# Patient Record
Sex: Female | Born: 1955
Health system: Southern US, Community
[De-identification: ages and names within clinical notes are randomized; demographics above are authoritative.]

## PROBLEM LIST (undated history)

## (undated) ENCOUNTER — Emergency Department (HOSPITAL_COMMUNITY): Disposition: A | Discharge status: Home / Self Care | Payer: 59

## (undated) DIAGNOSIS — E213 Hyperparathyroidism, unspecified: Secondary | ICD-10-CM

## (undated) DIAGNOSIS — K589 Irritable bowel syndrome without diarrhea: Secondary | ICD-10-CM

## (undated) DIAGNOSIS — F419 Anxiety disorder, unspecified: Secondary | ICD-10-CM

## (undated) DIAGNOSIS — E785 Hyperlipidemia, unspecified: Secondary | ICD-10-CM

## (undated) DIAGNOSIS — Z87442 Personal history of urinary calculi: Secondary | ICD-10-CM

## (undated) DIAGNOSIS — K219 Gastro-esophageal reflux disease without esophagitis: Secondary | ICD-10-CM

## (undated) HISTORY — PX: COLONOSCOPY: SHX174

## (undated) HISTORY — DX: Hyperlipidemia, unspecified: E78.5

## (undated) HISTORY — DX: Anxiety disorder, unspecified: F41.9

## (undated) HISTORY — PX: CERVICAL BIOPSY: SHX590

## (undated) HISTORY — DX: Irritable bowel syndrome, unspecified: K58.9

## (undated) HISTORY — PX: TUMOR REMOVAL: SHX12

## (undated) HISTORY — PX: CHOLECYSTECTOMY: SHX55

---

## 2000-09-24 HISTORY — PX: CHOLECYSTECTOMY, LAPAROSCOPIC: SHX56

## 2001-08-04 ENCOUNTER — Encounter: Payer: Self-pay | Admitting: Internal Medicine

## 2001-08-04 ENCOUNTER — Ambulatory Visit (HOSPITAL_COMMUNITY): Admission: RE | Admit: 2001-08-04 | Discharge: 2001-08-04 | Payer: Self-pay | Admitting: Internal Medicine

## 2001-11-05 ENCOUNTER — Emergency Department (HOSPITAL_COMMUNITY): Admission: EM | Admit: 2001-11-05 | Discharge: 2001-11-05 | Payer: Self-pay

## 2004-11-17 ENCOUNTER — Ambulatory Visit: Payer: Self-pay | Admitting: Internal Medicine

## 2005-06-08 ENCOUNTER — Ambulatory Visit: Payer: Self-pay | Admitting: Internal Medicine

## 2007-06-26 ENCOUNTER — Ambulatory Visit: Payer: Self-pay | Admitting: Internal Medicine

## 2007-08-15 ENCOUNTER — Ambulatory Visit: Payer: Self-pay | Admitting: Internal Medicine

## 2007-08-15 ENCOUNTER — Telehealth: Payer: Self-pay | Admitting: Internal Medicine

## 2007-08-18 ENCOUNTER — Encounter: Payer: Self-pay | Admitting: Internal Medicine

## 2007-10-07 ENCOUNTER — Ambulatory Visit: Payer: Self-pay | Admitting: Gastroenterology

## 2008-03-15 ENCOUNTER — Ambulatory Visit: Payer: Self-pay | Admitting: Internal Medicine

## 2008-03-15 ENCOUNTER — Telehealth (INDEPENDENT_AMBULATORY_CARE_PROVIDER_SITE_OTHER): Payer: Self-pay | Admitting: *Deleted

## 2008-03-17 ENCOUNTER — Encounter (INDEPENDENT_AMBULATORY_CARE_PROVIDER_SITE_OTHER): Payer: Self-pay | Admitting: *Deleted

## 2008-05-14 ENCOUNTER — Encounter: Payer: Self-pay | Admitting: Internal Medicine

## 2008-06-02 ENCOUNTER — Encounter: Payer: Self-pay | Admitting: Internal Medicine

## 2008-07-01 ENCOUNTER — Ambulatory Visit: Payer: Self-pay | Admitting: Internal Medicine

## 2009-01-07 ENCOUNTER — Encounter (INDEPENDENT_AMBULATORY_CARE_PROVIDER_SITE_OTHER): Payer: Self-pay | Admitting: *Deleted

## 2009-08-29 ENCOUNTER — Encounter (INDEPENDENT_AMBULATORY_CARE_PROVIDER_SITE_OTHER): Payer: Self-pay

## 2009-08-30 ENCOUNTER — Ambulatory Visit: Payer: Self-pay | Admitting: Gastroenterology

## 2009-09-13 ENCOUNTER — Ambulatory Visit: Payer: Self-pay | Admitting: Gastroenterology

## 2009-09-13 LAB — HM COLONOSCOPY

## 2009-09-14 ENCOUNTER — Encounter: Payer: Self-pay | Admitting: Gastroenterology

## 2009-10-24 ENCOUNTER — Telehealth (INDEPENDENT_AMBULATORY_CARE_PROVIDER_SITE_OTHER): Payer: Self-pay | Admitting: *Deleted

## 2010-01-24 ENCOUNTER — Ambulatory Visit: Payer: Self-pay | Admitting: Internal Medicine

## 2010-01-24 DIAGNOSIS — R87619 Unspecified abnormal cytological findings in specimens from cervix uteri: Secondary | ICD-10-CM | POA: Insufficient documentation

## 2010-01-24 DIAGNOSIS — Z8719 Personal history of other diseases of the digestive system: Secondary | ICD-10-CM | POA: Insufficient documentation

## 2010-01-24 DIAGNOSIS — N959 Unspecified menopausal and perimenopausal disorder: Secondary | ICD-10-CM | POA: Insufficient documentation

## 2010-01-24 DIAGNOSIS — F411 Generalized anxiety disorder: Secondary | ICD-10-CM | POA: Insufficient documentation

## 2010-07-26 ENCOUNTER — Ambulatory Visit: Payer: Self-pay | Admitting: Internal Medicine

## 2010-08-07 ENCOUNTER — Telehealth (INDEPENDENT_AMBULATORY_CARE_PROVIDER_SITE_OTHER): Payer: Self-pay | Admitting: *Deleted

## 2010-08-08 ENCOUNTER — Telehealth (INDEPENDENT_AMBULATORY_CARE_PROVIDER_SITE_OTHER): Payer: Self-pay | Admitting: *Deleted

## 2010-08-08 ENCOUNTER — Ambulatory Visit: Payer: Self-pay | Admitting: Internal Medicine

## 2010-08-08 DIAGNOSIS — E785 Hyperlipidemia, unspecified: Secondary | ICD-10-CM | POA: Insufficient documentation

## 2010-08-08 LAB — CONVERTED CEMR LAB
Cholesterol, target level: 200 mg/dL
HDL goal, serum: 40 mg/dL
LDL Goal: 110 mg/dL

## 2010-10-22 LAB — CONVERTED CEMR LAB
ALT: 23 units/L (ref 0–35)
AST: 26 units/L (ref 0–37)
Albumin: 4.3 g/dL (ref 3.5–5.2)
Alkaline Phosphatase: 66 units/L (ref 39–117)
BUN: 20 mg/dL (ref 6–23)
Basophils Absolute: 0 10*3/uL (ref 0.0–0.1)
Basophils Relative: 0.3 % (ref 0.0–3.0)
Bilirubin, Direct: 0.1 mg/dL (ref 0.0–0.3)
CO2: 27 meq/L (ref 19–32)
Calcium: 9.9 mg/dL (ref 8.4–10.5)
Chloride: 103 meq/L (ref 96–112)
Cholesterol: 323 mg/dL — ABNORMAL HIGH (ref 0–200)
Creatinine, Ser: 0.7 mg/dL (ref 0.4–1.2)
Direct LDL: 230.8 mg/dL
Eosinophils Absolute: 0.1 10*3/uL (ref 0.0–0.7)
Eosinophils Relative: 2.2 % (ref 0.0–5.0)
GFR calc non Af Amer: 93.1 mL/min (ref >60)
Glucose, Bld: 87 mg/dL (ref 70–99)
HCT: 40.2 % (ref 36.0–46.0)
HDL: 37.2 mg/dL — ABNORMAL LOW (ref >39.00)
Hemoglobin: 13.7 g/dL (ref 12.0–15.0)
Hgb A1c MFr Bld: 5.8 % (ref 4.6–6.5)
Lymphocytes Relative: 56.7 % — ABNORMAL HIGH (ref 12.0–46.0)
Lymphs Abs: 2.7 10*3/uL (ref 0.7–4.0)
MCHC: 34.1 g/dL (ref 30.0–36.0)
MCV: 90.6 fL (ref 78.0–100.0)
Monocytes Absolute: 0.4 10*3/uL (ref 0.1–1.0)
Monocytes Relative: 8.2 % (ref 3.0–12.0)
Neutro Abs: 1.6 10*3/uL (ref 1.4–7.7)
Neutrophils Relative %: 32.6 % — ABNORMAL LOW (ref 43.0–77.0)
Platelets: 199 10*3/uL (ref 150.0–400.0)
Potassium: 3.9 meq/L (ref 3.5–5.1)
RBC: 4.43 M/uL (ref 3.87–5.11)
RDW: 12.8 % (ref 11.5–14.6)
Sodium: 139 meq/L (ref 135–145)
TSH: 0.94 microintl units/mL (ref 0.35–5.50)
Total Bilirubin: 1 mg/dL (ref 0.3–1.2)
Total CHOL/HDL Ratio: 9
Total Protein: 7.1 g/dL (ref 6.0–8.3)
Triglycerides: 211 mg/dL — ABNORMAL HIGH (ref 0.0–149.0)
VLDL: 42.2 mg/dL — ABNORMAL HIGH (ref 0.0–40.0)
Vit D, 25-Hydroxy: 36 ng/mL (ref 30–89)
WBC: 4.8 10*3/uL (ref 4.5–10.5)

## 2010-10-24 NOTE — Miscellaneous (Signed)
Summary: Lec previsit  Clinical Lists Changes  Medications: Added new medication of MOVIPREP 100 GM  SOLR (PEG-KCL-NACL-NASULF-NA ASC-C) As per prep instructions. - Signed Rx of MOVIPREP 100 GM  SOLR (PEG-KCL-NACL-NASULF-NA ASC-C) As per prep instructions.;  #1 x 0;  Signed;  Entered by: Ulis Rias RN;  Authorized by: Meryl Dare MD Clementeen Graham;  Method used: Electronically to [facility] [phone]*, [address] [place], [place]  [address], Ph: [phone], Fax: [phone]    Prescriptions: MOVIPREP 100 GM  SOLR (PEG-KCL-NACL-NASULF-NA ASC-C) As per prep instructions.  #1 x 0   Entered by:   Ulis Rias RN   Authorized by:   Meryl Dare MD [facility]   Signed by:   Ulis Rias RN on 08/30/2009   Method used:   Electronically to        [facility] [phone]* (retail)       [address]       [place], [place]  [address]       Ph: [phone]       Fax: [phone]   RxID:   [id number]

## 2010-10-24 NOTE — Assessment & Plan Note (Signed)
Summary: CPX/NS/KDC   Vital Signs:  Patient profile:   55 year old female Height:      69 inches Weight:      190 pounds Temp:     98.2 degrees F oral Pulse rate:   68 / minute Resp:     14 per minute BP sitting:   118 / 82  (left arm)  Vitals Entered By: Jeremy Johann CMA (Jan 24, 2010 2:31 PM)  CC: CPX, FASTING, General Medical Evaluation Comments REVIEWED MED LIST, PATIENT AGREED DOSE AND INSTRUCTION CORRECT    CC:  CPX, FASTING, and General Medical Evaluation.  History of Present Illness: Melissa Miles is here for a physical; she is asymptomatic. She has lost 8# in 3 months with TLC , exercise & decreased portions & decreased sweets.  Preventive Screening-Counseling & Management  Alcohol-Tobacco     Smoking Status: never  Caffeine-Diet-Exercise     Does Patient Exercise: yes  Allergies (verified): No Known Drug Allergies  Past History:  Past Medical History: Anxiety IBS, Dr Russella Dar  Past Surgical History: Cholecystectomy-2002 Colonoscopy X 3 , all negative; G 2 P 2; S/P  cervical biopsy in12/2010  Family History: 1/2 sister ? bipolar; M ? anxiety, gall bladder disease, S/P cholecystectomy,arthritis; 1/2 bro thyroid disease; F NHL  Social History: Occupation:Pre Engineer, site Married Never Smoked Alcohol use-yes Regular exercise-yes: weights , sit ups  Review of Systems  The patient denies anorexia, fever, vision loss, decreased hearing, hoarseness, chest pain, syncope, dyspnea on exertion, peripheral edema, prolonged cough, headaches, hemoptysis, abdominal pain, melena, hematochezia, severe indigestion/heartburn, hematuria, incontinence, suspicious skin lesions, unusual weight change, abnormal bleeding, enlarged lymph nodes, and angioedema.         PAP negative 01/12/2010 Psych:  Complains of anxiety and irritability; denies depression, easily angered, easily tearful, and panic attacks.  Physical Exam  General:  well-nourished; alert,appropriate and  cooperative throughout examination Head:  Normocephalic and atraumatic without obvious abnormalities. Eyes:  No corneal or conjunctival inflammation noted. Perrla. Funduscopic exam benign, without hemorrhages, exudates or papilledema. Ears:  External ear exam shows no significant lesions or deformities.  Otoscopic examination reveals clear canals, tympanic membranes are intact bilaterally without bulging, retraction, inflammation or discharge. Hearing is grossly normal bilaterally. Nose:  External nasal examination shows no deformity or inflammation. Nasal mucosa are pink and moist without lesions or exudates. Mouth:  Oral mucosa and oropharynx without lesions or exudates.  Teeth in good repair. Neck:  No deformities, masses, or tenderness noted. Breasts:  Mammograms 07/2009 Lungs:  Normal respiratory effort, chest expands symmetrically. Lungs are clear to auscultation, no crackles or wheezes. Heart:  Normal rate and regular rhythm. S1 and S2 normal without gallop, murmur, click, rub.S4 Abdomen:  Bowel sounds positive,abdomen soft and non-tender without masses, organomegaly or hernias noted. Genitalia:  Dr Chevis Pretty seen 01/12/2010 Msk:  No deformity or scoliosis noted of thoracic or lumbar spine.   Pulses:  R and L carotid,radial,dorsalis pedis and posterior tibial pulses are full and equal bilaterally Extremities:  No clubbing, cyanosis, edema, or deformity noted with normal full range of motion of all joints.   minor crepitus of knees Neurologic:  alert & oriented X3 and DTRs symmetrical and normal.   Skin:  Intact without suspicious lesions or rashes Cervical Nodes:  No lymphadenopathy noted Axillary Nodes:  No palpable lymphadenopathy Psych:  memory intact for recent and remote, normally interactive, good eye contact, not anxious appearing, and not depressed appearing.     Impression & Recommendations:  Problem #  1:  ROUTINE GENERAL MEDICAL EXAM@HEALTH  CARE FACL  (ICD-V70.0)  Orders: Venipuncture (24401) TLB-Lipid Panel (80061-LIPID) TLB-BMP (Basic Metabolic Panel-BMET) (80048-METABOL) TLB-CBC Platelet - w/Differential (85025-CBCD) TLB-Hepatic/Liver Function Pnl (80076-HEPATIC) TLB-TSH (Thyroid Stimulating Hormone) (84443-TSH) EKG w/ Interpretation (93000) T-Vitamin D (25-Hydroxy) (02725-36644)  Problem # 2:  ANXIETY DISORDER (ICD-300.00)  weight gain with Paroxetine The following medications were removed from the medication list:    Paroxetine Hcl 20 Mg Tabs (Paroxetine hcl) .Marland Kitchen... 1 by mouth qd Her updated medication list for this problem includes:    Cymbalta 60 Mg Cpep (Duloxetine hcl) .Marland Kitchen... 1 once daily  Orders: Venipuncture (03474)  Problem # 3:  PAP SMEAR, ABNORMAL (ICD-795.00)  as per Dr Chevis Pretty; repeat PAP normal 12/2009  Orders: Venipuncture (25956)  Problem # 4:  IRRITABLE BOWEL SYNDROME, HX OF (ICD-V12.79) Quiescent Orders: Venipuncture (38756)  Problem # 5:  POSTMENOPAUSAL SYNDROME (ICD-627.9)  Orders: T-Vitamin D (25-Hydroxy) (43329-51884)  Complete Medication List: 1)  Multivitamin  2)  Viactiv  3)  Cymbalta 60 Mg Cpep (Duloxetine hcl) .Marland Kitchen.. 1 once daily  Other Orders: Tdap => 60yrs IM (16606) Admin 1st Vaccine (30160) Admin 1st Vaccine [facility]) [phone])  Patient Instructions: 1)  Take Cymbalta 30 mg once daily X 7 days then 60 mg once daily . Prescriptions: CYMBALTA 60 MG CPEP (DULOXETINE HCL) 1 once daily  #30 x 11   Entered and Authorized by:   Marga Melnick MD   Signed by:   Marga Melnick MD on 01/24/2010   Method used:   Print then Give to Patient   RxID:   [id number]    Tetanus/Td Vaccine    Vaccine Type: Tdap    Site: left deltoid    Mfr: GlaxoSmithKline    Dose: 0.5 ml    Route: IM    Given by: Jeremy Johann CMA    Exp. Date: 12/17/2011    Lot #: [id number]    VIS given: 08/12/07 version given Jan 24, 2010.

## 2010-10-24 NOTE — Assessment & Plan Note (Signed)
Summary: tb test//tl  Nurse Visit    Prior Medications: MULTIVITAMIN ()  VIACTIV ()  PAROXETINE HCL 20 MG TABS (PAROXETINE HCL) 1 by mouth qd Current Allergies: No known allergies    PPD Application    Vaccine Type: PPD    Site: left forearm    Mfr: Sanofi Pasteur    Dose: 0.1 ml    Route: ID    Given by: [employer]    Exp. Date: 09/03/2009    Lot #: Merlín.[id number]     ]  Complete Medication List: 1)  Multivitamin  2)  Viactiv  3)  Paroxetine Hcl 20 Mg Tabs (Paroxetine hcl) .Marland Kitchen.. 1 by mouth qd   Appended Document: tb test//tl    Nurse Visit    Prior Medications: MULTIVITAMIN ()  VIACTIV ()  PAROXETINE HCL 20 MG TABS (PAROXETINE HCL) 1 by mouth qd Current Allergies: No known allergies    PPD Results    Date of reading: 08/18/2007    Results: < 5mm    Interpretation: negative     ]

## 2010-10-24 NOTE — Consult Note (Signed)
Summary: [facility]   Imported By: Lanelle Bal 06/10/2008 09:52:25  _____________________________________________________________________  External Attachment:    Type:   Image     Comment:   External Document  Appended Document: [facility] Dr Gerrit Friends: benign cystic mass

## 2010-10-24 NOTE — Letter (Signed)
Summary: Primary Care Consult Scheduled Letter  [facility]  [address] [place], [place] [address]   Phone: [phone]  Fax: [phone]      03/17/2008 MRN: [id number]  [facility] [address] [place], [place]  [address]    Dear Melissa Miles,      We have scheduled an appointment for you.  At the recommendation of Dr.Hopper, we have scheduled you a consult with DR. Gerkin on August 3rd at 4:00 check in at 3:30pm.  Their address is [facility] [address] [phone]. The office phone number is [phone].  If this appointment day and time is not convenient for you, please feel free to call the office of the doctor you are being referred to at the number listed above and reschedule the appointment.     It is important for you to keep your scheduled appointments. We are here to make sure you are given good patient care. If you have questions or you have made changes to your appointment, please notify us at  [phone], ask for Tiffany.    Thank you,  Patient Care Coordinator [employer] [facility]

## 2010-10-24 NOTE — Progress Notes (Signed)
Summary: Appointment Due  Phone Note Outgoing Call Call back at [facility] Phone [phone]   Call placed by: Shonna Chock,  October 24, 2009 1:34 PM Call placed to: Patient Summary of Call: Melissa Miles PLEASE contact patient to schedule a CPX, patient's last appointment with Dr.Hopper was 06/2008. An appointment is necessary to continue refilling medications.  Thanks./Chrae Malloy  October 24, 2009 1:35 PM     Additional Follow-up for Phone Call Additional follow up Details #2::    PATIENT HAS A APPT ON MAY 3,2011...Marland KitchenMarland KitchenBarb Merino  October 24, 2009 3:37 PM  Follow-up by: Barb Merino,  October 24, 2009 3:37 PM

## 2010-10-24 NOTE — Procedures (Signed)
Summary: Colonoscopy  Patient: Melissa Miles Note: All result statuses are Final unless otherwise noted.  Tests: (1) Colonoscopy (COL)   COL Colonoscopy           DONE     [facility]     [address]     [place], [place]  [address]           COLONOSCOPY PROCEDURE REPORT           PATIENT:  Melissa Miles, Melissa Miles  MR#:  [id number]     BIRTHDATE:  1955-09-27, 53 yrs. old  GENDER:  female           ENDOSCOPIST:  Judie Petit T. Russella Dar, MD, [facility]           PROCEDURE DATE:  09/13/2009     PROCEDURE:  Colonoscopy with biopsy     ASA CLASS:  Class II     INDICATIONS:  1) Routine Risk Screening           MEDICATIONS:   Fentanyl 100 mcg IV, Versed 9 mg IV           DESCRIPTION OF PROCEDURE:   After the risks benefits and     alternatives of the procedure were thoroughly explained, informed     consent was obtained.  Digital rectal exam was performed and     revealed no abnormalities.   The LB [id number] endoscope     was introduced through the anus and advanced to the cecum, which     was identified by both the appendix and ileocecal valve, without     limitations.  The quality of the prep was excellent, using     MoviPrep.  The instrument was then slowly withdrawn as the colon     was fully examined.     <<PROCEDUREIMAGES>>           FINDINGS:  A normal appearing cecum, ileocecal valve, and     appendiceal orifice were identified. The ascending, hepatic     flexure, transverse, splenic flexure, descending, sigmoid colon,     and rectum appeared unremarkable.  Retroflexed views in the rectum     revealed a hypertrophied anal papillae. Biopsies obtained and sent     to pathology.   The time to cecum =  2  minutes. The scope was     then withdrawn (time =  9  min) from the patient and the procedure     completed.           COMPLICATIONS:  None           ENDOSCOPIC IMPRESSION:     1) Normal colon     2) Hypertrophied anal papillae           RECOMMENDATIONS:     1) Await  pathology results     2) Continue current colorectal screening recommendations for     "routine risk" patients with a repeat colonoscopy in 10 years.           Venita Lick. Russella Dar, MD, Clementeen Graham           CC: Teodora Medici, MD           n.     Rosalie DoctorVenita Lick. Reyhan Moronta at 09/13/2009 10:02 AM           Edyth Gunnels, [id number]  Note: An exclamation mark (!) indicates a result that was not dispersed into the flowsheet. Document Creation Date: 09/13/2009 10:03 AM _______________________________________________________________________  (  1) Order result status: Final Collection or observation date-time: 09/13/2009 09:56 Requested date-time:  Receipt date-time:  Reported date-time:  Referring Physician:   Ordering Physician: Claudette Head [phone]) Specimen Source:  Source: Launa Grill Order Number: [id number] Lab site:   Appended Document: Colonoscopy recall     Procedures Next Due Date:    Colonoscopy: 08/2019

## 2010-10-24 NOTE — Letter (Signed)
Summary: [facility] Instructions  [facility]  [address] [place], [place] [address]   Phone: [phone]  Fax: [phone]       Melissa Miles    17-Jun-1956    MRN: [id number]        Procedure Day /Date: Tuesday 12/21/2010_     Arrival Time: 8:30 am      Procedure Time: 9:30 am     Location of Procedure:                    _x _  [facility] (4th Floor)   PREPARATION FOR COLONOSCOPY WITH MOVIPREP   Starting 5 days prior to your procedure Thursday 12/16 do not eat nuts, seeds, popcorn, corn, beans, peas,  salads, or any raw vegetables.  Do not take any fiber supplements (e.g. Metamucil, Citrucel, and Benefiber).  THE DAY BEFORE YOUR PROCEDURE         DATE: Monday 12/20  1.  Drink clear liquids the entire day-NO SOLID FOOD  2.  Do not drink anything colored red or purple.  Avoid juices with pulp.  No orange juice.  3.  Drink at least 64 oz. (8 glasses) of fluid/clear liquids during the day to prevent dehydration and help the prep work efficiently.  CLEAR LIQUIDS INCLUDE: Water Jello Ice Popsicles Tea (sugar ok, no milk/cream) Powdered fruit flavored drinks Coffee (sugar ok, no milk/cream) Gatorade Juice: apple, white grape, white cranberry  Lemonade Clear bullion, consomm, broth Carbonated beverages (any kind) Strained chicken noodle soup Hard Candy                             4.  In the morning, mix first dose of MoviPrep solution:    Empty 1 Pouch A and 1 Pouch B into the disposable container    Add lukewarm drinking water to the top line of the container. Mix to dissolve    Refrigerate (mixed solution should be used within 24 hrs)  5.  Begin drinking the prep at 5:00 p.m. The MoviPrep container is divided by 4 marks.   Every 15 minutes drink the solution down to the next mark (approximately 8 oz) until the full liter is complete.   6.  Follow completed prep with 16 oz of clear liquid of your choice (Nothing red or purple).   Continue to drink clear liquids until bedtime.  7.  Before going to bed, mix second dose of MoviPrep solution:    Empty 1 Pouch A and 1 Pouch B into the disposable container    Add lukewarm drinking water to the top line of the container. Mix to dissolve    Refrigerate  THE DAY OF YOUR PROCEDURE      DATE: Tuesday 12/21 Beginning at 4:30 a.m. (5 hours before procedure):         1. Every 15 minutes, drink the solution down to the next mark (approx 8 oz) until the full liter is complete.  2. Follow completed prep with 16 oz. of clear liquid of your choice.    3. You may drink clear liquids until 7:30 am  (2 HOURS BEFORE PROCEDURE).   MEDICATION INSTRUCTIONS  Unless otherwise instructed, you should take regular prescription medications with a small sip of water   as early as possible the morning of your procedure.          OTHER INSTRUCTIONS  You will need a responsible adult at least  54 years of age to accompany you and drive you home.   This person must remain in the waiting room during your procedure.  Wear loose fitting clothing that is easily removed.  Leave jewelry and other valuables at home.  However, you may wish to bring a book to read or  an iPod/MP3 player to listen to music as you wait for your procedure to start.  Remove all body piercing jewelry and leave at home.  Total time from sign-in until discharge is approximately 2-3 hours.  You should go home directly after your procedure and rest.  You can resume normal activities the  day after your procedure.  The day of your procedure you should not:   Drive   Make legal decisions   Operate machinery   Drink alcohol   Return to work  You will receive specific instructions about eating, activities and medications before you leave.    The above instructions have been reviewed and explained to me by   Ulis Rias RN  August 30, 2009 1:15 PM    I fully understand and can verbalize these  instructions _____________________________ Date _________

## 2010-10-24 NOTE — Assessment & Plan Note (Signed)
Summary: knot on bottom right cheek--acute only--tl   Vital Signs:  Patient Profile:   55 Years Old Female Weight:      184.6 pounds Temp:     98.6 degrees F oral Pulse rate:   76 / minute BP sitting:   120 / 78  (left arm) Cuff size:   large  Pt. in pain?   no  Vitals Entered By: Shonna Chock (March 15, 2008 1:31 PM)                  Chief Complaint:  KNOT ON BOTTOM (RIGHT SIDE) X COUPLE MONTHS.  History of Present Illness: Weight loss is purposeful with CVE & decreased portions. No constitutional symptoms; lesion noted incidentally post shower.Lesion essentially stable over 8 weeks or more. Father had lymphoma.    Current Allergies (reviewed today): No known allergies   Past Surgical History:    Cholecystectomy-2001?    Risk Factors:  Tobacco use:  never Alcohol use:  no Exercise:  yes    Times per week:  5-7    Type:  WALKING,CARDIO,WEIGHTS   Review of Systems  General      Denies chills, fatigue, fever, and sweats.   Physical Exam  General:     Well-developed,well-nourished,in no acute distress; alert,appropriate and cooperative throughout examination Abdomen:     Bowel sounds positive,abdomen soft and non-tender without masses, organomegaly or hernias noted.Dullness RUQ w/o nodule Skin:     26 X 24 mm firm Subcutaneous  structure which is not movable ; it appears to transilluminate  Cervical Nodes:     No lymphadenopathy noted Axillary Nodes:     No palpable lymphadenopathy Inguinal Nodes:     No significant adenopathy    Impression & Recommendations:  Problem # 1:  MASS, SUPERFICIAL (ICD-782.2) Assessment: Unchanged R/O lipoma vs granuloma post trauma Orders: Surgical Referral (Surgery)   Problem # 2:  LYMPHOMA, FAMILY HX (ICD-V16.7)  Orders: Surgical Referral (Surgery)   Complete Medication List: 1)  Multivitamin  2)  Viactiv  3)  Paroxetine Hcl 20 Mg Tabs (Paroxetine hcl) .Marland Kitchen.. 1 by mouth qd   Patient Instructions: 1)   Monitor size of mass  weekly until seen By Dr Gerrit Friends   ]

## 2010-10-24 NOTE — Assessment & Plan Note (Signed)
Summary: roa discuss med. refill,cbs  Medications Added * MULTIVITAMIN  * VIACTIV  PAROXETINE HCL 20 MG TABS (PAROXETINE HCL) 1 by mouth qd      Allergies Added: NKDA  Vital Signs:  Patient Profile:   55 Years Old Female Weight:      200.25 pounds Pulse rate:   64 / minute Pulse rhythm:   regular BP sitting:   138 / 80  (left arm) Cuff size:   large  Pt. in pain?   no  Vitals Entered By: Wendall Stade (June 26, 2007 3:30 PM)                  Chief Complaint:  med refill.  History of Present Illness: Paroxetine is a valuable tool to prevent anxiety. Neurotransmitter sheet reviewed; serotonin deficiency suggested.  Current Allergies (reviewed today): No known allergies  Updated/Current Medications (including changes made in today's visit):  * MULTIVITAMIN  * VIACTIV  PAROXETINE HCL 20 MG TABS (PAROXETINE HCL) 1 by mouth qd   Past Medical History:    Anxiety   Family History:    Sister ? bipolar; M ? anxiety    Review of Systems  CV      Denies palpitations.  GI      Denies diarrhea.  Derm      Denies flushing.  Neuro      Denies headaches.  Psych      Complains of anxiety, depression, easily tearful, and irritability.      Denies panic attacks and sense of great danger.   Physical Exam  General:     Well-developed,well-nourished,in no acute distress; alert,appropriate and cooperative throughout examination Neck:     No deformities, masses, or tenderness noted. Heart:     Normal rate and regular rhythm. S1 and S2 normal without gallop, murmur, click, rub or other extra sounds. Psych:     Oriented X3, memory intact for recent and remote, normally interactive, good eye contact, not anxious appearing, and not depressed appearing.      Impression & Recommendations:  Problem # 1:  ANXIETY (ICD-300.00)  Her updated medication list for this problem includes:    Paroxetine Hcl 20 Mg Tabs (Paroxetine hcl) .Marland Kitchen... 1 by mouth  qd   Complete Medication List: 1)  Multivitamin  2)  Viactiv  3)  Paroxetine Hcl 20 Mg Tabs (Paroxetine hcl) .Marland Kitchen.. 1 by mouth qd     Prescriptions: PAROXETINE HCL 20 MG TABS (PAROXETINE HCL) 1 by mouth qd  #90 x 3   Entered and Authorized by:   Marga Melnick MD   Signed by:   Marga Melnick MD on 06/26/2007   Method used:   Print then Give to Patient   RxID:   [id number]  ]

## 2010-10-24 NOTE — Assessment & Plan Note (Signed)
Summary: flu shot/cbs   Nurse Visit    Prior Medications: MULTIVITAMIN ()  VIACTIV ()  PAROXETINE HCL 20 MG TABS (PAROXETINE HCL) 1 by mouth qd Current Allergies: No known allergies   Flu Vaccine Consent Questions     Do you have a history of severe allergic reactions to this vaccine? no    Any prior history of allergic reactions to egg and/or gelatin? no    Do you have a sensitivity to the preservative Thimersol? no    Do you have a past history of Guillan-Barre Syndrome? no    Do you currently have an acute febrile illness? no    Have you ever had a severe reaction to latex? no    Vaccine information given and explained to patient? yes    Are you currently pregnant? no    Lot Number:[id number]   Site Given Right Deltoid IM  Chrae Malloy  July 02, 2008 8:44 AM   Orders Added: 1)  Admin 1st Vaccine [90471] 2)  Flu Vaccine 2yrs + Baden.Dew    ]

## 2010-10-24 NOTE — Op Note (Signed)
Summary: Excision Soft Tissue Mass/[facility]  [facility]cision Soft Tissue [facility]   Imported By: Lanelle Bal 05/19/2008 10:11:42  _____________________________________________________________________  External Attachment:    Type:   Image     Comment:   External Document

## 2010-10-24 NOTE — Letter (Signed)
Summary: Recall Colonoscopy Letter  [facility]  [address] [place], [place] [address]   Phone: [phone]  Fax: [phone]      January 07, 2009 MRN: [id number]   [facility] [address] [place], [place]  [address]   Dear Ms. Melissa Miles,   According to your medical record, it is time for you to schedule a Colonoscopy. The American Cancer Society recommends this procedure as a method to detect early colon cancer. Patients with a family history of colon cancer, or a personal history of colon polyps or inflammatory bowel disease are at increased risk.  This letter has beeen generated based on the recommendations made at the time of your procedure. If you feel that in your particular situation this may no longer apply, please contact our office.  Please call our office at [phone] to schedule this appointment or to update your records at your earliest convenience.  Thank you for cooperating with [place] to provide you with the very best care possible.   Sincerely,  Judie Petit T. Russella Dar, M.D.  [facility] [phone]

## 2010-10-24 NOTE — Progress Notes (Signed)
Summary: EMPLOYMENT MED REPORT--NEED ASAP  Phone Note Call from Patient Call back at Work Phone [phone]   Caller: Patient Summary of Call: DR Rosabell Geyer PATIENT  PATIENT DROPPED OFF EMPLOYMENT MED REPORT (HAD LAB APPT TODAY TO GET TB TEST--WILL RETURN MONDAY FOR READING)  NEEDS FORM ASAP PLEASE -- WILL ATTACH FORM TO CHART AND TAKE BACK TO [facility]  PLEASE CALL [phone] WHEN READY Initial call taken by: Jerolyn Shin,  August 15, 2007 3:59 PM  Follow-up for Phone Call        hopp she needs monday Follow-up by: Wendall Stade,  August 15, 2007 4:07 PM  Additional Follow-up for Phone Call Additional follow up Details #1::        form ready for PPD reading 11/24 Additional Follow-up by: Marga Melnick MD,  August 15, 2007 5:14 PM         Appended Document: EMPLOYMENT MED REPORT--NEED ASAP patient picked up form on monday and copy scanned into chart

## 2010-10-24 NOTE — Letter (Signed)
Summary: Patient Notice- Colon Biospy Results  [facility]  [address] [place], [place] [address]   Phone: [phone]  Fax: [phone]        September 14, 2009 MRN: [id number]    [facility] [address] [place], [place]  [address]    Dear Melissa Miles,  I am pleased to inform you that the biopsies taken during your recent colonoscopy did not show any evidence of cancer upon pathologic examination. The biopsies showed findings consistent with a hypertrophied anal papillae as I expected.   No further action is needed at this time.  Please follow-up with      your primary care physician for your other healthcare needs.  Continue with the treatment plan as outlined on the day of your      exam.  Please call us if you are having persistent problems or have questions about your condition that have not been fully answered at this time.  Sincerely,  Meryl Dare MD [facility]  This letter has been electronically signed by your physician.    Appended Document: Patient Notice- Colon Biospy Results Letter mailed 12.27.10.

## 2010-10-24 NOTE — Assessment & Plan Note (Signed)
Summary: F/U on Labs/scm   Vital Signs:  Patient profile:   55 year old female Weight:      171.4 pounds BMI:     25.40 Pulse rate:   76 / minute Resp:     14 per minute BP sitting:   110 / 62  (left arm) Cuff size:   large  Vitals Entered By: Shonna Chock CMA (August 08, 2010 3:52 PM) CC: Follow-up on labs (copy of NMR given) , Lipid Management   CC:  Follow-up on labs (copy of NMR given)  and Lipid Management.  History of Present Illness: Hyperlipidemia Follow-Up      This is a 55 year old woman who presents for Hyperlipidemia follow-up.  The patient denies the following symptoms: chest pain/pressure, exercise intolerance, dypsnea, palpitations, syncope, and pedal edema.  Dietary compliance has been  very good; she has lost 29#.  The patient reports exercising 5-6X/week on treadmill & walking .  Adjunctive measures currently used by the patient include ASA.  NMR reviewed & risks discussed.  Lipid Management History:      Positive NCEP/ATP III risk factors include HDL cholesterol less than 40 and family history for ischemic heart disease (males less than 2 years old).  Negative NCEP/ATP III risk factors include female age less than 36 years old, non-diabetic, non-tobacco-user status, non-hypertensive, no ASHD (atherosclerotic heart disease), no prior stroke/TIA, no peripheral vascular disease, and no history of aortic aneurysm.     Current Medications (verified): 1)  Multivitamin 2)  Viactiv 3)  Cymbalta 60 Mg Cpep (Duloxetine Hcl) .Marland Kitchen.. 1 Once Daily 4)  Vitamin D3 1000 Unit Tabs (Cholecalciferol) .Marland Kitchen.. 1 By Mouth Once Daily  Allergies (verified): No Known Drug Allergies  Past History:  Past Medical History: Anxiety IBS, Dr Russella Dar Hyperlipidemia: NMR Lipoprofile 07/2010: LDL 221(2819/1785), HDL 43, TG 159. LD goal = < 110.[place] Study LDL goal = < 130.(1/2  bro : MI @ 79)  Physical Exam  General:  well-nourished;alert,appropriate and cooperative throughout  examination Heart:  Normal rate and regular rhythm. S1 and S2 normal without gallop, murmur, click, rub or other extra sounds. Pulses:  R and L carotid,radial,dorsalis pedis and posterior tibial pulses are full and equal bilaterally Psych:  Oriented X3.  Focused & intelligent   Impression & Recommendations:  Problem # 1:  HYPERLIPIDEMIA (ICD-272.4)  Her updated medication list for this problem includes:    Pravastatin Sodium 20 Mg Tabs (Pravastatin sodium) .Marland Kitchen... 1 at bedtime  Problem # 2:  ISCHEMIC HEART DISEASE, FAMILY HX (ICD-V17.3)  Complete Medication List: 1)  Multivitamin  2)  Viactiv  3)  Cymbalta 60 Mg Cpep (Duloxetine hcl) .Marland Kitchen.. 1 once daily 4)  Vitamin D3 1000 Unit Tabs (Cholecalciferol) .Marland Kitchen.. 1 by mouth once daily 5)  Pravastatin Sodium 20 Mg Tabs (Pravastatin sodium) .Marland Kitchen.. 1 at bedtime  Lipid Assessment/Plan:      Based on NCEP/ATP III, the patient's risk factor category is "2 or more risk factors and a calculated 10 year CAD risk of > 20%".  The patient's lipid goals are as follows: Total cholesterol goal is 200; LDL cholesterol goal is 110; HDL cholesterol goal is 40; Triglyceride goal is 150.    Patient Instructions: 1)  Please schedule a follow-up appointment in 3 months. 2)  Hepatic Panel prior to visit, ICD-9:995.20 3)  Lipid Panel prior to visit, ICD-9:272.4 Prescriptions: PRAVASTATIN SODIUM 20 MG TABS (PRAVASTATIN SODIUM) 1 at bedtime  #90 x 0   Entered and Authorized by:   Chrissie Noa  Calbert Hulsebus MD   Signed by:   Marga Melnick MD on 08/08/2010   Method used:   Print then Give to Patient   RxID:   [id number]    Orders Added: 1)  Est. Patient Level III [95621]

## 2010-10-24 NOTE — Progress Notes (Signed)
Summary: knot on bottom  Phone Note Call from Patient   Caller: Patient Summary of Call: dr. hopper Patient has a big knot under her skin on her right cheek on her bottom and would like to be seen this morning she has the bring her mother in at 10:30. offered an appt for tomorrow at 9:30 pt refused  Initial call taken by: Charolette Child,  March 15, 2008 9:56 AM  Follow-up for Phone Call        spoke with chrae recommend that the patient be worked in this afternoon. I informed patient and she is coming into the office at 12:45 Follow-up by: Charolette Child,  March 15, 2008 10:01 AM

## 2010-10-24 NOTE — Progress Notes (Signed)
Summary: LAB RESULTS  Phone Note Call from Patient Call back at Work Phone [phone]   Caller: Patient Reason for Call: Talk to Nurse Summary of Call: PATIENT CALLED MADE APPT TO DISCUSS NMR, BUT IS REQUESTING TO SPEAK W/CHRAE PRIOR, WORRIED SOMETHING IS WRONG. Initial call taken by: Magdalen Spatz [facility],  August 08, 2010 8:27 AM  Follow-up for Phone Call        I spoke with patient and briefly went over her NMR and changed appointment time to today to ease patient instead of waiting until Friday (Friday appointment cancelled) Follow-up by: Shonna Chock CMA,  August 08, 2010 8:44 AM

## 2010-10-24 NOTE — Progress Notes (Signed)
Summary: Appointment Due  Phone Note Outgoing Call Call back at Home Phone [phone]   Call placed by: Shonna Chock CMA,  August 07, 2010 4:00 PM Summary of Call: Left message on voicemail for patient to call and schedule appointment to discuss NMR (special chlosterol test), copy of report held at my desk  and copy sent to be scanned./Chrae [facility] CMA  August 07, 2010 4:02 PM

## 2010-10-24 NOTE — Miscellaneous (Signed)
Summary: Immunizations  Immunizations   Imported By: Freddy Jaksch 08/18/2007 16:21:43  _____________________________________________________________________  External Attachment:    Type:   Image     Comment:   External Document

## 2010-10-27 NOTE — Letter (Signed)
Summary: STAFF MEDICAL REPORT  STAFF MEDICAL REPORT   Imported By: Freddy Jaksch 08/28/2007 16:51:09  _____________________________________________________________________  External Attachment:    Type:   Image     Comment:   External Document

## 2011-02-06 NOTE — Assessment & Plan Note (Signed)
[facility]                         GASTROENTEROLOGY OFFICE NOTE   NAME:Kratz, CHELSAE ZANELLA                    MRN:          [id number]  DATE:10/07/2007                            DOB:          30-Dec-1955    This is a 55 year old white female whom I have followed for diarrhea  predominant irritable bowel syndrome. She underwent colonoscopy in June  of 2003, that was entirely normal to the terminal ileum with random  biopsies of the colon showing no pathology. She has had intermittent  troubles with her irritable bowel syndrome and it initially responded  well to the p.r.n. use of dicyclomine. 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 She states that she  had worsening problems with urgent watery non-bloody diarrhea and mucous  per rectum. After the holidays her bowel habits returned to normal and  she has no gastrointestinal complaints today. She notes no weight loss,  fevers or chills. She denies any antibiotic usage over the past several  months and notes no medication changes. There is no family history of  colon cancer, colon polyps or inflammatory bowel disease.   PAST MEDICAL HISTORY:  1. Diarrhea predominant irritable bowel syndrome.  2. Status post cholecystectomy.   CURRENT MEDICATIONS:  1. Paroxetine 15 mg daily.  2. Imodium AD daily p.r.n.   MEDICATION ALLERGIES:  None known.   SOCIAL HISTORY:  Per the handwritten form.   REVIEW OF SYSTEMS:  Per the handwritten form.   PHYSICAL EXAMINATION:  In no acute distress. Height 6 feet, weight 196  pounds. Blood pressure 130/82, pulse 80 and regular.  HEENT: Anicteric sclerae. Oropharynx clear.  CHEST: Clear to auscultation bilaterally.  CARDIAC: Regular rate and rhythm without murmurs appreciated.  ABDOMEN: Soft and nontender. Nondistended. Normoactive bowel sounds. No  palpable organomegaly, masses or hernias.  EXTREMITIES: Without clubbing, cyanosis or edema.  NEUROLOGIC: Alert and oriented x3. Grossly nonfocal.   ASSESSMENT/PLAN:  Flare of diarrhea predominant irritable bowel syndrome  and her bowel habits have returned to normal. Discontinue dicyclomine.  May use glycopyrrolate 2 mg one-half  to one whole tablet b.i.d. as needed along with Imodium AD b.i.d. as  needed. If these measures  are not effective in controlling her symptoms she is to return for  followup, otherwise, I will see her in one year.     Venita Lick. Russella Dar, MD, [facility]  Electronically Signed    MTS/MedQ  DD: 10/07/2007  DT: 10/07/2007  Job #: [id number]

## 2011-02-26 ENCOUNTER — Telehealth: Payer: Self-pay | Admitting: Internal Medicine

## 2011-02-26 MED ORDER — DULOXETINE HCL 60 MG PO CPEP: 60.0000 mg | ORAL_CAPSULE | Freq: Every day | ORAL | Status: DC

## 2011-03-31 ENCOUNTER — Other Ambulatory Visit: Payer: Self-pay | Admitting: Internal Medicine

## 2011-09-09 ENCOUNTER — Other Ambulatory Visit: Payer: Self-pay | Admitting: Internal Medicine

## 2011-09-10 NOTE — Telephone Encounter (Signed)
Patient needs to schedule a CPX  

## 2011-10-12 ENCOUNTER — Other Ambulatory Visit: Payer: Self-pay | Admitting: Internal Medicine

## 2011-10-12 MED ORDER — PRAVASTATIN SODIUM 20 MG PO TABS: 20.0000 mg | ORAL_TABLET | Freq: Every day | ORAL | Status: DC

## 2011-10-12 NOTE — Telephone Encounter (Signed)
Rx sent 

## 2011-10-16 ENCOUNTER — Other Ambulatory Visit: Payer: Self-pay | Admitting: Internal Medicine

## 2011-11-07 ENCOUNTER — Other Ambulatory Visit: Payer: Self-pay | Admitting: Gynecology

## 2011-11-15 ENCOUNTER — Other Ambulatory Visit: Payer: Self-pay | Admitting: Internal Medicine

## 2011-11-29 ENCOUNTER — Telehealth: Payer: Self-pay | Admitting: *Deleted

## 2011-11-29 NOTE — Telephone Encounter (Signed)
[facility] Triage Call Report Triage Record Num: [id number] Operator: Aundra Millet Patient Name: Melissa Miles Call Date & Time: 11/29/2011 9:20:40AM Patient Phone: [phone] PCP: Marga Melnick Patient Gender: Female PCP Fax : [phone] Patient DOB: 1955-10-01 Practice Name: [facility] Reason for Call: Caller: Luvenia/Mother; PCP: Marga Melnick; CB#: [phone]; ; ; Call regarding Sore Throat; Onset of bouts of deep sore throat since 11/22/2011. Has tried gargling, warm fluids and Advil but still bothersome. Has had some slight cough and have taken Mucinex. RN reached See in 24 hrs DISP for swollen glands on sides of neck per Sore throat protocol . No EPIC appts seen - RN called office and transffered to [facility] -- appt available for Friday AM 11/30/2011 or can be seen today at [facility] Protocol(s) Used: Sore Throat or Hoarseness Recommended Outcome per Protocol: See Provider within 24 hours Reason for Outcome: New onset of painful, swollen glands on sides of neck or under jaw Care Advice: Call provider if any of the following signs and symptoms develop: severe sore throat, enlarged tonsils with white or yellow patches, tender swollen glands, fever, generally feel ill, persistent low grade headache, or abdominal pain. ~ Apply warm, moist soaks or compresses to the affected area for 20-30 minutes 3 to 4 times per day. Avoid burning skin by using water no hotter than bath water and by not lying on the compresses. ~ ~ SYMPTOM / CONDITION MANAGEMENT Most adults need to drink 6-10 eight-ounce glasses (1.2-2.0 liters) of fluids per day unless previously told to limit fluid intake for other medical reasons. Limit fluids that contain caffeine, sugar or alcohol. Urine will be a very light yellow color when you drink enough fluids. ~ Analgesic/Antipyretic Advice - Acetaminophen: Consider acetaminophen as directed on label or by pharmacist/provider for pain or  fever PRECAUTIONS: - Use if there is no history of liver disease, alcoholism, or intake of three or more alcohol drinks per day - Only if approved by provider during pregnancy or when breastfeeding - During pregnancy, acetaminophen should not be taken more than 3 consecutive days without telling provider - Do not exceed recommended dose or frequency ~ Sore Throat Relief: - Use warm salt water gargles 3 to 4 times/day, as needed (1/2 tsp. salt in 8 oz. [.2 liters] water). - Suck on hard candy, nonprescription or herbal throat lozenges (sugar-free if diabetic) - Eat soothing, soft food/fluids (broths, soups, or honey and lemon juice in hot tea, Popsicles, frozen yogurt or sherbet, scrambled eggs, cooked cereals, Jell-O or puddings) whichever is most comforting. - Avoid eating salty, spicy or acidic foods. ~ Analgesic/Antipyretic Advice - NSAIDs: Consider aspirin, ibuprofen, naproxen or ketoprofen for pain or fever as directed on label or by pharmacist/provider. PRECAUTIONS: - If over 80 years of age, should not take longer than 1 week without consulting provider. EXCEPTIONS: - Should not be used if taking blood thinners or have bleeding problems. - Do not use if have history of sensitivity/allergy to any of these medications; or history of cardiovascular, ulcer, kidney, liver disease or diabetes unless approved by provider.

## 2011-11-29 NOTE — Telephone Encounter (Signed)
Noted, patient with pending appointment tomorrow

## 2011-11-30 ENCOUNTER — Encounter: Payer: Self-pay | Admitting: Family Medicine

## 2011-11-30 ENCOUNTER — Ambulatory Visit (INDEPENDENT_AMBULATORY_CARE_PROVIDER_SITE_OTHER): Payer: BC Managed Care – PPO | Admitting: Family Medicine

## 2011-11-30 VITALS — BP 132/80 | HR 100 | Temp 98.8°F | Wt 195.6 lb

## 2011-11-30 DIAGNOSIS — J029 Acute pharyngitis, unspecified: Secondary | ICD-10-CM

## 2011-11-30 LAB — POCT RAPID STREP A (OFFICE): Rapid Strep A Screen: NEGATIVE

## 2011-11-30 NOTE — Assessment & Plan Note (Signed)
New.  No evidence of bacterial infxn on PE.  Most likely viral w/ PND contribution.  Start OTC antihistamine.  Reviewed supportive care and red flags that should prompt return.  Pt expressed understanding and is in agreement w/ plan.

## 2011-11-30 NOTE — Patient Instructions (Signed)
This is a viral illness and should improve w/ time Drink plenty of fluids Alternate Tylenol and Ibuprofen every 4 hrs for pain Add Claritin or Zyrtec daily to decrease post nasal drip REST! Call with any questions or concerns Hang in there!

## 2011-11-30 NOTE — Progress Notes (Signed)
  Subjective:    Patient ID: Melissa Miles, female    DOB: 24-May-1956, 56 y.o.   MRN: [id number]  HPI Sore throat- reports sxs are worse at night, 'it's like someone has put a blow torch to it'.  sxs started 3 weeks ago w/ cough, hoarseness.  This improved and then 1 week ago throat started hurting.  Taking ibuprofen and gargling.  + sick contacts.  Denies PND.  + dry cough.  No ear pain, sinus pressure.  No fevers.   Review of Systems For ROS see HPI     Objective:   Physical Exam  Vitals reviewed. Constitutional: She appears well-developed and well-nourished. No distress.  HENT:  Head: Normocephalic and atraumatic.  Right Ear: Tympanic membrane normal.  Left Ear: Tympanic membrane normal.  Nose: Mucosal edema and rhinorrhea present. Right sinus exhibits no maxillary sinus tenderness and no frontal sinus tenderness. Left sinus exhibits no maxillary sinus tenderness and no frontal sinus tenderness.  Mouth/Throat: Mucous membranes are normal. Posterior oropharyngeal erythema (w/ PND) present.  Eyes: Conjunctivae and EOM are normal. Pupils are equal, round, and reactive to light.  Neck: Normal range of motion. Neck supple.  Cardiovascular: Normal rate, regular rhythm and normal heart sounds.   Pulmonary/Chest: Effort normal and breath sounds normal. No respiratory distress. She has no wheezes. She has no rales.  Lymphadenopathy:    She has no cervical adenopathy.          Assessment & Plan:

## 2011-12-06 ENCOUNTER — Encounter: Payer: Self-pay | Admitting: Internal Medicine

## 2011-12-06 ENCOUNTER — Ambulatory Visit (INDEPENDENT_AMBULATORY_CARE_PROVIDER_SITE_OTHER): Payer: BC Managed Care – PPO | Admitting: Internal Medicine

## 2011-12-06 VITALS — BP 126/78 | HR 99 | Temp 98.2°F | Resp 14 | Ht 68.75 in | Wt 191.4 lb

## 2011-12-06 DIAGNOSIS — E785 Hyperlipidemia, unspecified: Secondary | ICD-10-CM

## 2011-12-06 DIAGNOSIS — Z0000 Encounter for general adult medical examination without abnormal findings: Secondary | ICD-10-CM

## 2011-12-06 MED ORDER — DULOXETINE HCL 60 MG PO CPEP: 60.0000 mg | ORAL_CAPSULE | Freq: Every day | ORAL | Status: DC

## 2011-12-06 NOTE — Patient Instructions (Signed)
Preventive Health Care: Exercise  30-45  minutes a day, 3-4 days a week. Walking is especially valuable in preventing Osteoporosis. Eat a low-fat diet with lots of fruits and vegetables, up to 7-9 servings per day. Avoid obesity; your goal = waist less than 35 inches.Consume less than 30 grams of sugar per day from foods & drinks with High Fructose Corn Syrup as # 1,2,3 or #4 on label. Health Care Power of Attorney & Living Will place you in charge of your health care  decisions. Verify these are  in place. Depression is common in our stressful world. If fit progresses despite interventions discussed, please call.

## 2011-12-06 NOTE — Progress Notes (Signed)
Subjective:    Patient ID: Melissa Miles, female    DOB: 03-21-1956, 56 y.o.   MRN: [id number]  HPI Melissa Miles is here for a physical; she denies acute issues except for mild situational depression. She lost 2 pet cats; she is unable to see her grandchild who lives in [place]; she is having work stress as well      Review of Systems  Patient reports no significant  vision/ hearing  changes, adenopathy,fever,   persistant / recurrent hoarseness , swallowing issues, chest pain,palpitations,edema,persistant /recurrent cough, hemoptysis, dyspnea( rest/ exertional/paroxysmal nocturnal), gastrointestinal bleeding(melena, rectal bleeding), abdominal pain, significant heartburn,  bowel changes,GU symptoms(dysuria, hematuria,pyuria, incontinence), Gyn symptoms(abnormal  bleeding , pain),  syncope, focal weakness, memory loss,numbness & tingling, skin/hair /nail changes, or abnormal bruising or bleeding.      Objective:   Physical Exam  Gen.: Healthy and well-nourished in appearance. Alert, appropriate and cooperative throughout exam. Head: Normocephalic without obvious abnormalities  Eyes: No corneal or conjunctival inflammation noted. Pupils equal round reactive to light and accommodation. Fundal exam is benign without hemorrhages, exudate, papilledema. Extraocular motion intact. Vision grossly normal. Ears: External  ear exam reveals no significant lesions or deformities. Canals clear .TMs normal. Hearing is grossly normal bilaterally. Nose: External nasal exam reveals no deformity or inflammation. Nasal mucosa are pink and moist. No lesions or exudates noted.   Mouth: Oral mucosa and oropharynx reveal no lesions or exudates. Teeth in good repair. Neck: No deformities, masses, or tenderness noted. Range of motion & Thyroid normal Lungs: Normal respiratory effort; chest expands symmetrically. Lungs are clear to auscultation without rales, wheezes, or increased work of breathing. Heart: Normal rate and  rhythm. Normal S1 and S2. No gallop, click, or rub. S 4 w/o  murmur. Abdomen: Bowel sounds normal; abdomen soft and nontender. No masses, organomegaly or hernias noted. Genitalia: Dr Chevis Pretty  ; he ordered BMD also                                                                                Musculoskeletal/extremities: Lordosis noted of  the thoracic  spine. No clubbing, cyanosis, edema, or deformity noted. Range of motion  normal .Tone & strength  normal.Joints normal. Nail health  good. Vascular: Carotid, radial artery, dorsalis pedis and  posterior tibial pulses are full and equal. No bruits present. Neurologic: Alert and oriented x3. Deep tendon reflexes symmetrical and normal.          Skin: Intact without suspicious lesions or rashes. Lymph: No cervical, axillary  lymphadenopathy present. Psych: Mood and affect are normal. Normally interactive                                                                                          Assessment & Plan:  #1 comprehensive physical exam; no acute findings #2 see Problem List with Assessments &  Recommendations #3 mild situational depression Plan: see Orders

## 2011-12-07 LAB — HEPATIC FUNCTION PANEL
ALT: 32 U/L (ref 0–35)
AST: 35 U/L (ref 0–37)
Albumin: 4.3 g/dL (ref 3.5–5.2)
Alkaline Phosphatase: 69 U/L (ref 39–117)
Bilirubin, Direct: 0.1 mg/dL (ref 0.0–0.3)
Total Bilirubin: 0.8 mg/dL (ref 0.3–1.2)
Total Protein: 7.9 g/dL (ref 6.0–8.3)

## 2011-12-07 LAB — BASIC METABOLIC PANEL
BUN: 22 mg/dL (ref 6–23)
CO2: 27 mEq/L (ref 19–32)
Calcium: 10 mg/dL (ref 8.4–10.5)
Chloride: 103 mEq/L (ref 96–112)
Creatinine, Ser: 0.8 mg/dL (ref 0.4–1.2)
GFR: 81.6 mL/min (ref >60.00)
Glucose, Bld: 96 mg/dL (ref 70–99)
Potassium: 4.1 mEq/L (ref 3.5–5.1)
Sodium: 139 mEq/L (ref 135–145)

## 2011-12-07 LAB — CBC WITH DIFFERENTIAL/PLATELET
Basophils Absolute: 0 10*3/uL (ref 0.0–0.1)
Basophils Relative: 0.7 % (ref 0.0–3.0)
Eosinophils Absolute: 0.4 10*3/uL (ref 0.0–0.7)
Eosinophils Relative: 8.8 % — ABNORMAL HIGH (ref 0.0–5.0)
HCT: 42.5 % (ref 36.0–46.0)
Hemoglobin: 14.2 g/dL (ref 12.0–15.0)
Lymphocytes Relative: 33.7 % (ref 12.0–46.0)
Lymphs Abs: 1.7 10*3/uL (ref 0.7–4.0)
MCHC: 33.5 g/dL (ref 30.0–36.0)
MCV: 89.8 fl (ref 78.0–100.0)
Monocytes Absolute: 1 10*3/uL (ref 0.1–1.0)
Monocytes Relative: 19.7 % — ABNORMAL HIGH (ref 3.0–12.0)
Neutro Abs: 1.9 10*3/uL (ref 1.4–7.7)
Neutrophils Relative %: 37.1 % — ABNORMAL LOW (ref 43.0–77.0)
Platelets: 269 10*3/uL (ref 150.0–400.0)
RBC: 4.73 Mil/uL (ref 3.87–5.11)
RDW: 13.5 % (ref 11.5–14.6)
WBC: 5 10*3/uL (ref 4.5–10.5)

## 2011-12-07 LAB — LDL CHOLESTEROL, DIRECT: Direct LDL: 198.8 mg/dL

## 2011-12-07 LAB — TSH: TSH: 1.04 u[IU]/mL (ref 0.35–5.50)

## 2011-12-07 LAB — LIPID PANEL
Cholesterol: 290 mg/dL — ABNORMAL HIGH (ref 0–200)
HDL: 44.7 mg/dL (ref >39.00)
Total CHOL/HDL Ratio: 6
Triglycerides: 255 mg/dL — ABNORMAL HIGH (ref 0.0–149.0)
VLDL: 51 mg/dL — ABNORMAL HIGH (ref 0.0–40.0)

## 2011-12-27 ENCOUNTER — Other Ambulatory Visit: Payer: Self-pay | Admitting: Internal Medicine

## 2011-12-27 DIAGNOSIS — D72820 Lymphocytosis (symptomatic): Secondary | ICD-10-CM

## 2012-02-12 ENCOUNTER — Other Ambulatory Visit (INDEPENDENT_AMBULATORY_CARE_PROVIDER_SITE_OTHER): Payer: BC Managed Care – PPO

## 2012-02-12 DIAGNOSIS — D72820 Lymphocytosis (symptomatic): Secondary | ICD-10-CM

## 2012-02-12 LAB — CBC WITH DIFFERENTIAL/PLATELET
Basophils Absolute: 0 10*3/uL (ref 0.0–0.1)
Basophils Relative: 0.4 % (ref 0.0–3.0)
Eosinophils Absolute: 0.1 10*3/uL (ref 0.0–0.7)
Eosinophils Relative: 1.9 % (ref 0.0–5.0)
HCT: 39.2 % (ref 36.0–46.0)
Hemoglobin: 13.1 g/dL (ref 12.0–15.0)
Lymphocytes Relative: 32.1 % (ref 12.0–46.0)
Lymphs Abs: 1.9 10*3/uL (ref 0.7–4.0)
MCHC: 33.4 g/dL (ref 30.0–36.0)
MCV: 90.9 fl (ref 78.0–100.0)
Monocytes Absolute: 0.4 10*3/uL (ref 0.1–1.0)
Monocytes Relative: 6.7 % (ref 3.0–12.0)
Neutro Abs: 3.5 10*3/uL (ref 1.4–7.7)
Neutrophils Relative %: 58.9 % (ref 43.0–77.0)
Platelets: 219 10*3/uL (ref 150.0–400.0)
RBC: 4.32 Mil/uL (ref 3.87–5.11)
RDW: 13 % (ref 11.5–14.6)
WBC: 6 10*3/uL (ref 4.5–10.5)

## 2012-07-06 ENCOUNTER — Other Ambulatory Visit: Payer: Self-pay | Admitting: Internal Medicine

## 2012-11-08 ENCOUNTER — Other Ambulatory Visit: Payer: Self-pay

## 2012-11-26 ENCOUNTER — Other Ambulatory Visit: Payer: Self-pay | Admitting: Internal Medicine

## 2012-11-27 NOTE — Telephone Encounter (Signed)
Please schedule CPX 

## 2012-12-27 ENCOUNTER — Other Ambulatory Visit: Payer: Self-pay | Admitting: Internal Medicine

## 2012-12-29 ENCOUNTER — Other Ambulatory Visit: Payer: Self-pay | Admitting: Internal Medicine

## 2012-12-29 NOTE — Telephone Encounter (Signed)
Schedule CPX 

## 2013-01-05 LAB — HM PAP SMEAR

## 2013-01-05 LAB — HM MAMMOGRAPHY

## 2013-01-28 ENCOUNTER — Other Ambulatory Visit: Payer: Self-pay | Admitting: Internal Medicine

## 2013-03-03 ENCOUNTER — Other Ambulatory Visit: Payer: Self-pay | Admitting: Internal Medicine

## 2013-03-05 ENCOUNTER — Other Ambulatory Visit: Payer: Self-pay | Admitting: Internal Medicine

## 2013-03-09 ENCOUNTER — Encounter: Payer: Self-pay | Admitting: Internal Medicine

## 2013-03-09 ENCOUNTER — Ambulatory Visit (INDEPENDENT_AMBULATORY_CARE_PROVIDER_SITE_OTHER): Payer: 59 | Admitting: Internal Medicine

## 2013-03-09 VITALS — BP 118/76 | HR 93 | Wt 196.0 lb

## 2013-03-09 DIAGNOSIS — F411 Generalized anxiety disorder: Secondary | ICD-10-CM

## 2013-03-09 DIAGNOSIS — E785 Hyperlipidemia, unspecified: Secondary | ICD-10-CM

## 2013-03-09 MED ORDER — PRAVASTATIN SODIUM 20 MG PO TABS: 20.0000 mg | ORAL_TABLET | Freq: Every day | ORAL | Status: DC

## 2013-03-09 MED ORDER — DULOXETINE HCL 60 MG PO CPEP: 60.0000 mg | ORAL_CAPSULE | Freq: Every day | ORAL | Status: DC

## 2013-03-09 NOTE — Assessment & Plan Note (Signed)
I'll schedule fasting Labs : Lipids, hepatic panel, & TSH.

## 2013-03-09 NOTE — Progress Notes (Signed)
  Subjective:    Patient ID: Melissa Miles, female    DOB: 08/19/1956, 57 y.o.   MRN: [id number]  HPI She is here for refill of her generic Cymbalta and pravastatin. Past medical history was updated    Review of Systems She is on a modified heart healthy diet; she exercises as walking> 30 minutes 3-4 times per week without symptoms. Specifically she denies chest pain, palpitations, dyspnea, or claudication. Family history is positive for premature coronary disease in her 1/2brother. Advanced cholesterol testing reveals her LDL goal is less than 100; ideally <70. There is medication compliance with the statin. Significant abdominal symptoms, memory deficit, or myalgias denied.  She feels that the generic Cymbalta is of significant benefit, mainly in controlling her anxiety. .     Objective:   Physical Exam Appears healthy and well-nourished & in no acute distress  No carotid bruits are present.No neck pain distention present at 10 - 15 degrees. Thyroid normal to palpation  Heart rhythm and rate are normal with no significant murmurs or gallops. S4  Chest is clear with no increased work of breathing  There is no evidence of aortic aneurysm or renal artery bruits  Abdomen soft with no organomegaly or masses. No HJR  No clubbing, cyanosis or edema present.  Pedal pulses are intact   No ischemic skin changes are present . Nails healthy    Alert and oriented. Strength, tone, DTRs reflexes normal         Assessment & Plan:  See Current Assessment & Plan in Problem List under specific Diagnosis

## 2013-03-09 NOTE — Assessment & Plan Note (Signed)
Excellent response ; no change

## 2013-03-09 NOTE — Patient Instructions (Addendum)
I have  scheduled fasting Labs @ [facility]: Lipids, hepatic panel,  TSH.  If you activate the  My Chart system; lab & Xray results will be released directly  to you as soon as I review & address these through the computer. If you choose not to sign up for My Chart within 36 hours of labs being drawn; results will be reviewed & interpretation added before being copied & mailed, causing a delay in getting the results to you.If you do not receive that report within 7-10 days ,please call. Additionally you can use this system to gain direct  access to your records  if  out of town or @ an office of a  physician who is not in  the My Chart network.  This improves continuity of care & places you in control of your medical record.

## 2013-04-21 ENCOUNTER — Other Ambulatory Visit (INDEPENDENT_AMBULATORY_CARE_PROVIDER_SITE_OTHER): Payer: 59

## 2013-04-21 DIAGNOSIS — E785 Hyperlipidemia, unspecified: Secondary | ICD-10-CM

## 2013-04-21 LAB — LIPID PANEL
Cholesterol: 260 mg/dL — ABNORMAL HIGH (ref 0–200)
HDL: 35.6 mg/dL — ABNORMAL LOW (ref >39.00)
Total CHOL/HDL Ratio: 7
Triglycerides: 197 mg/dL — ABNORMAL HIGH (ref 0.0–149.0)
VLDL: 39.4 mg/dL (ref 0.0–40.0)

## 2013-04-21 LAB — TSH: TSH: 1.46 u[IU]/mL (ref 0.35–5.50)

## 2013-04-21 LAB — HEPATIC FUNCTION PANEL
ALT: 35 U/L (ref 0–35)
AST: 35 U/L (ref 0–37)
Albumin: 4.2 g/dL (ref 3.5–5.2)
Alkaline Phosphatase: 59 U/L (ref 39–117)
Bilirubin, Direct: 0.1 mg/dL (ref 0.0–0.3)
Total Bilirubin: 0.8 mg/dL (ref 0.3–1.2)
Total Protein: 7.5 g/dL (ref 6.0–8.3)

## 2013-04-21 LAB — LDL CHOLESTEROL, DIRECT: Direct LDL: 181 mg/dL

## 2013-05-04 ENCOUNTER — Other Ambulatory Visit: Payer: Self-pay | Admitting: Gynecology

## 2013-07-30 ENCOUNTER — Other Ambulatory Visit: Payer: Self-pay

## 2013-09-10 ENCOUNTER — Other Ambulatory Visit: Payer: Self-pay | Admitting: Internal Medicine

## 2013-09-11 NOTE — Telephone Encounter (Signed)
Pravastatin refilled per protocol. JG//CMA 

## 2014-01-05 ENCOUNTER — Telehealth: Payer: Self-pay

## 2014-01-05 NOTE — Telephone Encounter (Signed)
Pap and mammogram done 4/14 with Dr.[facility] at [facility]. She gets her pap's done q13m due to abnormal pap. She did not receive a flu vaccine. Medication and allergies update. No changes in [facility] hx--    KP

## 2014-01-05 NOTE — Telephone Encounter (Signed)
Left message for call back Non-identifiable   Pap- [facility]- 09/13/09-normal (Dr. Fuller Plan) [facility] BD- 12/25/11- osteopenia Flu Td- 01/24/10

## 2014-01-06 ENCOUNTER — Encounter: Payer: Self-pay | Admitting: Family Medicine

## 2014-01-06 ENCOUNTER — Ambulatory Visit (INDEPENDENT_AMBULATORY_CARE_PROVIDER_SITE_OTHER): Payer: Managed Care, Other (non HMO) | Admitting: Family Medicine

## 2014-01-06 VITALS — BP 118/78 | HR 80 | Temp 98.4°F | Resp 16 | Ht 68.5 in | Wt 192.4 lb

## 2014-01-06 DIAGNOSIS — E785 Hyperlipidemia, unspecified: Secondary | ICD-10-CM

## 2014-01-06 DIAGNOSIS — E669 Obesity, unspecified: Secondary | ICD-10-CM | POA: Insufficient documentation

## 2014-01-06 DIAGNOSIS — M899 Disorder of bone, unspecified: Secondary | ICD-10-CM

## 2014-01-06 DIAGNOSIS — M8580 Other specified disorders of bone density and structure, unspecified site: Secondary | ICD-10-CM | POA: Insufficient documentation

## 2014-01-06 DIAGNOSIS — M949 Disorder of cartilage, unspecified: Secondary | ICD-10-CM

## 2014-01-06 DIAGNOSIS — E663 Overweight: Secondary | ICD-10-CM | POA: Insufficient documentation

## 2014-01-06 DIAGNOSIS — F411 Generalized anxiety disorder: Secondary | ICD-10-CM

## 2014-01-06 NOTE — Patient Instructions (Signed)
Schedule your complete physical in 6 months We'll notify you of your lab results and make any changes if needed Try and make healthy food choices and get regular exercise!  You can do it! Call with any questions or concerns Welcome!  We're glad to have you!!

## 2014-01-06 NOTE — Assessment & Plan Note (Signed)
Chronic problem.  Well controlled on Cymbalta.  No med changes at this time.

## 2014-01-06 NOTE — Assessment & Plan Note (Signed)
New.  Encouraged pt to make healthy food choices and get regular exercise.  Will follow.

## 2014-01-06 NOTE — Progress Notes (Signed)
Pre visit review using our clinic review tool, if applicable. No additional management support is needed unless otherwise documented below in the visit note. 

## 2014-01-06 NOTE — Assessment & Plan Note (Signed)
New to provider, following w/ Dr Avanell Shackleton.  On Ca and Vit D daily.  Check Vit D level.

## 2014-01-06 NOTE — Assessment & Plan Note (Signed)
Chronic problem.  Tolerating statin w/o difficulty.  Overdue for labs.  Adjust meds prn. 

## 2014-01-06 NOTE — Progress Notes (Signed)
   Subjective:    Patient ID: [name], female    DOB: 1956/07/02, 58 y.o.   MRN: [id number]  HPI Hyperlipidemia- chronic problem, on pravastatin.  No abd pain, N/V, myalgias.  Cymbalta- chronic problem, feels sxs are fairly well controlled on current dose.  Sleeping well at night.  Not tearful or sad.  Osteopenia- chronic problem, noted on DEXA 2013.  Taking Ca and Vit D.  Following w/ Dr Avanell Shackleton.  Obesity- ongoing issue for pt.  Reports weight fluctuates.  Has recently gained 'a lot of weight'.  Knows that she needs to restart exercise.  Review of Systems For ROS see HPI     Objective:   Physical Exam  Vitals reviewed. Constitutional: She is oriented to person, place, and time. She appears well-developed and well-nourished. No distress.  HENT:  Head: Normocephalic and atraumatic.  Eyes: Conjunctivae and EOM are normal. Pupils are equal, round, and reactive to light.  Neck: Normal range of motion. Neck supple. No thyromegaly present.  Cardiovascular: Normal rate, regular rhythm, normal heart sounds and intact distal pulses.   No murmur heard. Pulmonary/Chest: Effort normal and breath sounds normal. No respiratory distress.  Abdominal: Soft. She exhibits no distension. There is no tenderness.  Musculoskeletal: She exhibits no edema.  Lymphadenopathy:    She has no cervical adenopathy.  Neurological: She is alert and oriented to person, place, and time.  Skin: Skin is warm and dry.  Psychiatric: She has a normal mood and affect. Her behavior is normal.          Assessment & Plan:

## 2014-01-07 ENCOUNTER — Other Ambulatory Visit: Payer: Self-pay | Admitting: General Practice

## 2014-01-07 ENCOUNTER — Encounter: Payer: Self-pay | Admitting: General Practice

## 2014-01-07 LAB — CBC WITH DIFFERENTIAL/PLATELET
Basophils Absolute: 0 10*3/uL (ref 0.0–0.1)
Basophils Relative: 0.1 % (ref 0.0–3.0)
Eosinophils Absolute: 0.2 10*3/uL (ref 0.0–0.7)
Eosinophils Relative: 2.1 % (ref 0.0–5.0)
HCT: 40.1 % (ref 36.0–46.0)
Hemoglobin: 13.6 g/dL (ref 12.0–15.0)
Lymphocytes Relative: 32.4 % (ref 12.0–46.0)
Lymphs Abs: 2.4 10*3/uL (ref 0.7–4.0)
MCHC: 34 g/dL (ref 30.0–36.0)
MCV: 89.3 fl (ref 78.0–100.0)
Monocytes Absolute: 0.4 10*3/uL (ref 0.1–1.0)
Monocytes Relative: 5.3 % (ref 3.0–12.0)
Neutro Abs: 4.4 10*3/uL (ref 1.4–7.7)
Neutrophils Relative %: 60.1 % (ref 43.0–77.0)
Platelets: 214 10*3/uL (ref 150.0–400.0)
RBC: 4.49 Mil/uL (ref 3.87–5.11)
RDW: 12.8 % (ref 11.5–14.6)
WBC: 7.3 10*3/uL (ref 4.5–10.5)

## 2014-01-07 LAB — BASIC METABOLIC PANEL
BUN: 16 mg/dL (ref 6–23)
CO2: 28 mEq/L (ref 19–32)
Calcium: 10.4 mg/dL (ref 8.4–10.5)
Chloride: 101 mEq/L (ref 96–112)
Creatinine, Ser: 0.7 mg/dL (ref 0.4–1.2)
GFR: 87.41 mL/min (ref >60.00)
Glucose, Bld: 83 mg/dL (ref 70–99)
Potassium: 3.8 mEq/L (ref 3.5–5.1)
Sodium: 137 mEq/L (ref 135–145)

## 2014-01-07 LAB — HEPATIC FUNCTION PANEL
ALT: 31 U/L (ref 0–35)
AST: 29 U/L (ref 0–37)
Albumin: 4.1 g/dL (ref 3.5–5.2)
Alkaline Phosphatase: 56 U/L (ref 39–117)
Bilirubin, Direct: 0 mg/dL (ref 0.0–0.3)
Total Bilirubin: 1 mg/dL (ref 0.3–1.2)
Total Protein: 7.4 g/dL (ref 6.0–8.3)

## 2014-01-07 LAB — LIPID PANEL
Cholesterol: 265 mg/dL — ABNORMAL HIGH (ref 0–200)
HDL: 35.5 mg/dL — ABNORMAL LOW (ref >39.00)
LDL Cholesterol: 164 mg/dL — ABNORMAL HIGH (ref 0–99)
Total CHOL/HDL Ratio: 7
Triglycerides: 328 mg/dL — ABNORMAL HIGH (ref 0.0–149.0)
VLDL: 65.6 mg/dL — ABNORMAL HIGH (ref 0.0–40.0)

## 2014-01-07 LAB — TSH: TSH: 0.84 u[IU]/mL (ref 0.35–5.50)

## 2014-01-07 MED ORDER — ATORVASTATIN CALCIUM 40 MG PO TABS: 40.0000 mg | ORAL_TABLET | Freq: Every day | ORAL | Status: DC

## 2014-01-12 LAB — VITAMIN D 1,25 DIHYDROXY
Vitamin D 1, 25 (OH)2 Total: 46 pg/mL (ref 18–72)
Vitamin D2 1, 25 (OH)2: <8 pg/mL
Vitamin D3 1, 25 (OH)2: 46 pg/mL

## 2014-01-13 ENCOUNTER — Encounter: Payer: Self-pay | Admitting: General Practice

## 2014-03-24 ENCOUNTER — Telehealth: Payer: Self-pay | Admitting: Family Medicine

## 2014-03-24 DIAGNOSIS — F411 Generalized anxiety disorder: Secondary | ICD-10-CM

## 2014-03-24 MED ORDER — DULOXETINE HCL 60 MG PO CPEP: 60.0000 mg | ORAL_CAPSULE | Freq: Every day | ORAL | Status: DC

## 2014-03-24 NOTE — Telephone Encounter (Signed)
Caller name: Viktoriya Relation to pt: Call back [place]:  [facility]  Reason for call:  Pt is needing a refill on RX DULoxetine (CYMBALTA) 60 MG capsule [Pt is out of the medication, pt contacted pharmacy on Friday 6/26.

## 2014-03-24 NOTE — Telephone Encounter (Signed)
Med filled.  

## 2014-06-09 LAB — HM MAMMOGRAPHY: HM Mammogram: NORMAL

## 2014-07-09 ENCOUNTER — Ambulatory Visit (INDEPENDENT_AMBULATORY_CARE_PROVIDER_SITE_OTHER): Payer: Managed Care, Other (non HMO) | Admitting: Family Medicine

## 2014-07-09 ENCOUNTER — Encounter: Payer: Self-pay | Admitting: Family Medicine

## 2014-07-09 VITALS — BP 122/82 | HR 82 | Temp 98.3°F | Resp 16 | Ht 68.0 in | Wt 194.2 lb

## 2014-07-09 DIAGNOSIS — Z23 Encounter for immunization: Secondary | ICD-10-CM

## 2014-07-09 DIAGNOSIS — Z0000 Encounter for general adult medical examination without abnormal findings: Secondary | ICD-10-CM | POA: Insufficient documentation

## 2014-07-09 LAB — CBC WITH DIFFERENTIAL/PLATELET
Basophils Absolute: 0 10*3/uL (ref 0.0–0.1)
Basophils Relative: 0 % (ref 0–1)
Eosinophils Absolute: 0.1 10*3/uL (ref 0.0–0.7)
Eosinophils Relative: 2 % (ref 0–5)
HCT: 39.1 % (ref 36.0–46.0)
Hemoglobin: 13.3 g/dL (ref 12.0–15.0)
Lymphocytes Relative: 41 % (ref 12–46)
Lymphs Abs: 2.5 10*3/uL (ref 0.7–4.0)
MCH: 29.8 pg (ref 26.0–34.0)
MCHC: 34 g/dL (ref 30.0–36.0)
MCV: 87.5 fL (ref 78.0–100.0)
Monocytes Absolute: 0.4 10*3/uL (ref 0.1–1.0)
Monocytes Relative: 7 % (ref 3–12)
Neutro Abs: 3 10*3/uL (ref 1.7–7.7)
Neutrophils Relative %: 50 % (ref 43–77)
Platelets: 246 10*3/uL (ref 150–400)
RBC: 4.47 MIL/uL (ref 3.87–5.11)
RDW: 13.2 % (ref 11.5–15.5)
WBC: 6 10*3/uL (ref 4.0–10.5)

## 2014-07-09 LAB — LIPID PANEL
Cholesterol: 193 mg/dL (ref 0–200)
HDL: 39 mg/dL — ABNORMAL LOW (ref >39)
LDL Cholesterol: 117 mg/dL — ABNORMAL HIGH (ref 0–99)
Total CHOL/HDL Ratio: 4.9 Ratio
Triglycerides: 183 mg/dL — ABNORMAL HIGH (ref <150)
VLDL: 37 mg/dL (ref 0–40)

## 2014-07-09 LAB — BASIC METABOLIC PANEL
BUN: 21 mg/dL (ref 6–23)
CO2: 25 mEq/L (ref 19–32)
Calcium: 9.7 mg/dL (ref 8.4–10.5)
Chloride: 104 mEq/L (ref 96–112)
Creat: 0.71 mg/dL (ref 0.50–1.10)
Glucose, Bld: 87 mg/dL (ref 70–99)
Potassium: 3.9 mEq/L (ref 3.5–5.3)
Sodium: 139 mEq/L (ref 135–145)

## 2014-07-09 LAB — HEPATIC FUNCTION PANEL
ALT: 34 U/L (ref 0–35)
AST: 30 U/L (ref 0–37)
Albumin: 4.3 g/dL (ref 3.5–5.2)
Alkaline Phosphatase: 71 U/L (ref 39–117)
Bilirubin, Direct: 0.1 mg/dL (ref 0.0–0.3)
Indirect Bilirubin: 0.7 mg/dL (ref 0.2–1.2)
Total Bilirubin: 0.8 mg/dL (ref 0.2–1.2)
Total Protein: 7.1 g/dL (ref 6.0–8.3)

## 2014-07-09 LAB — TSH: TSH: 1.299 u[IU]/mL (ref 0.350–4.500)

## 2014-07-09 NOTE — Progress Notes (Signed)
Pre visit review using our clinic review tool, if applicable. No additional management support is needed unless otherwise documented below in the visit note. 

## 2014-07-09 NOTE — Assessment & Plan Note (Signed)
Pt's PE WNL.  UTD on GYN and colonoscopy.  Check labs.  Anticipatory guidance provided.  

## 2014-07-09 NOTE — Patient Instructions (Signed)
Follow up in 6 months to recheck cholesterol We'll notify you of your lab results and make any changes if needed Call with any questions or concerns Keep up the good work! Happy Fall!!!

## 2014-07-09 NOTE — Progress Notes (Signed)
   Subjective:    Patient ID: Melissa Miles, female    DOB: 1956-08-06, 58 y.o.   MRN: [id number]  HPI CPE- UTD on colonoscopy (2010), mammo, pap, DEXA ([facility])   Review of Systems Patient reports no vision/ hearing changes, adenopathy,fever, weight change,  persistant/recurrent hoarseness , swallowing issues, chest pain, palpitations, edema, persistant/recurrent cough, hemoptysis, dyspnea (rest/exertional/paroxysmal nocturnal), gastrointestinal bleeding (melena, rectal bleeding), abdominal pain, significant heartburn, bowel changes, GU symptoms (dysuria, hematuria, incontinence), Gyn symptoms (abnormal  bleeding, pain),  syncope, focal weakness, memory loss, numbness & tingling, skin/hair/nail changes, abnormal bruising or bleeding, anxiety, or depression.     Objective:   Physical Exam General Appearance:    Alert, cooperative, no distress, appears stated age  Head:    Normocephalic, without obvious abnormality, atraumatic  Eyes:    PERRL, conjunctiva/corneas clear, EOM's intact, fundi    benign, both eyes  Ears:    Normal TM's and external ear canals, both ears  Nose:   Nares normal, septum midline, mucosa normal, no drainage    or sinus tenderness  Throat:   Lips, mucosa, and tongue normal; teeth and gums normal  Neck:   Supple, symmetrical, trachea midline, no adenopathy;    Thyroid: no enlargement/tenderness/nodules  Back:     Symmetric, no curvature, ROM normal, no CVA tenderness  Lungs:     Clear to auscultation bilaterally, respirations unlabored  Chest Wall:    No tenderness or deformity   Heart:    Regular rate and rhythm, S1 and S2 normal, no murmur, rub   or gallop  Breast Exam:    Deferred to GYN  Abdomen:     Soft, non-tender, bowel sounds active all four quadrants,    no masses, no organomegaly  Genitalia:    Deferred to GYN  Rectal:    Extremities:   Extremities normal, atraumatic, no cyanosis or edema  Pulses:   2+ and symmetric all extremities  Skin:   Skin  color, texture, turgor normal, no rashes or lesions  Lymph nodes:   Cervical, supraclavicular, and axillary nodes normal  Neurologic:   CNII-XII intact, normal strength, sensation and reflexes    throughout          Assessment & Plan:

## 2014-07-10 LAB — VITAMIN D 25 HYDROXY (VIT D DEFICIENCY, FRACTURES): Vit D, 25-Hydroxy: 58 ng/mL (ref 30–89)

## 2014-07-12 ENCOUNTER — Encounter: Payer: Self-pay | Admitting: General Practice

## 2014-09-23 ENCOUNTER — Other Ambulatory Visit: Payer: Self-pay | Admitting: General Practice

## 2014-09-23 MED ORDER — ATORVASTATIN CALCIUM 40 MG PO TABS: 40.0000 mg | ORAL_TABLET | Freq: Every day | ORAL | Status: DC

## 2015-01-12 ENCOUNTER — Ambulatory Visit (INDEPENDENT_AMBULATORY_CARE_PROVIDER_SITE_OTHER): Payer: Managed Care, Other (non HMO) | Admitting: Family Medicine

## 2015-01-12 ENCOUNTER — Encounter: Payer: Self-pay | Admitting: Family Medicine

## 2015-01-12 VITALS — BP 122/82 | HR 96 | Temp 98.2°F | Resp 16 | Wt 200.5 lb

## 2015-01-12 DIAGNOSIS — E785 Hyperlipidemia, unspecified: Secondary | ICD-10-CM

## 2015-01-12 DIAGNOSIS — F411 Generalized anxiety disorder: Secondary | ICD-10-CM | POA: Diagnosis not present

## 2015-01-12 MED ORDER — ATORVASTATIN CALCIUM 40 MG PO TABS: 40.0000 mg | ORAL_TABLET | Freq: Every day | ORAL | Status: DC

## 2015-01-12 MED ORDER — DULOXETINE HCL 60 MG PO CPEP: 60.0000 mg | ORAL_CAPSULE | Freq: Every day | ORAL | Status: DC

## 2015-01-12 NOTE — Progress Notes (Signed)
Pre visit review using our clinic review tool, if applicable. No additional management support is needed unless otherwise documented below in the visit note. 

## 2015-01-12 NOTE — Assessment & Plan Note (Signed)
Chronic problem.  Tolerating statin w/o difficulty.  Check labs.  Adjust meds prn.  Stressed need for healthy diet and regular exercise.  Will follow. 

## 2015-01-12 NOTE — Patient Instructions (Signed)
Schedule your complete physical in 6 months We'll notify you of your lab results and make any changes if needed Try and get regular exercise and make healthy food choices Call with any questions or concerns Happy Spring!!!

## 2015-01-12 NOTE — Progress Notes (Signed)
   Subjective:    Patient ID: Melissa Miles, female    DOB: [birth year], 59 y.o.   MRN: [id number]  HPI Hyperlipidemia- chronic problem, on Lipitor daily.  Denies CP, SOB, HAs, visual changes, edema, abd pain, N/V.  No regular exercise.  Admits to poor dietary habits- particularly eating at night.   Review of Systems For ROS see HPI     Objective:   Physical Exam  Constitutional: She is oriented to person, place, and time. She appears well-developed and well-nourished. No distress.  HENT:  Head: Normocephalic and atraumatic.  Eyes: Conjunctivae and EOM are normal. Pupils are equal, round, and reactive to light.  Neck: Normal range of motion. Neck supple. No thyromegaly present.  Cardiovascular: Normal rate, regular rhythm, normal heart sounds and intact distal pulses.   No murmur heard. Pulmonary/Chest: Effort normal and breath sounds normal. No respiratory distress.  Abdominal: Soft. She exhibits no distension. There is no tenderness.  Musculoskeletal: She exhibits no edema.  Lymphadenopathy:    She has no cervical adenopathy.  Neurological: She is alert and oriented to person, place, and time.  Skin: Skin is warm and dry.  Psychiatric: She has a normal mood and affect. Her behavior is normal.  Vitals reviewed.         Assessment & Plan:

## 2015-01-13 LAB — LIPID PANEL
Cholesterol: 198 mg/dL (ref 0–200)
HDL: 35.6 mg/dL — ABNORMAL LOW (ref >39.00)
NonHDL: 162.4
Total CHOL/HDL Ratio: 6
Triglycerides: 349 mg/dL — ABNORMAL HIGH (ref 0.0–149.0)
VLDL: 69.8 mg/dL — ABNORMAL HIGH (ref 0.0–40.0)

## 2015-01-13 LAB — HEPATIC FUNCTION PANEL
ALT: 41 U/L — ABNORMAL HIGH (ref 0–35)
AST: 35 U/L (ref 0–37)
Albumin: 4.2 g/dL (ref 3.5–5.2)
Alkaline Phosphatase: 63 U/L (ref 39–117)
Bilirubin, Direct: 0.1 mg/dL (ref 0.0–0.3)
Total Bilirubin: 0.5 mg/dL (ref 0.2–1.2)
Total Protein: 7.4 g/dL (ref 6.0–8.3)

## 2015-01-13 LAB — BASIC METABOLIC PANEL
BUN: 29 mg/dL — ABNORMAL HIGH (ref 6–23)
CO2: 25 mEq/L (ref 19–32)
Calcium: 10.2 mg/dL (ref 8.4–10.5)
Chloride: 105 mEq/L (ref 96–112)
Creatinine, Ser: 0.75 mg/dL (ref 0.40–1.20)
GFR: 84.42 mL/min (ref >60.00)
Glucose, Bld: 83 mg/dL (ref 70–99)
Potassium: 3.8 mEq/L (ref 3.5–5.1)
Sodium: 137 mEq/L (ref 135–145)

## 2015-01-13 LAB — LDL CHOLESTEROL, DIRECT: Direct LDL: 121 mg/dL

## 2015-01-14 NOTE — Addendum Note (Signed)
Addended by: Peggyann Shoals on: 01/14/2015 11:01 AM   Modules accepted: Orders

## 2015-01-17 ENCOUNTER — Telehealth: Payer: Self-pay | Admitting: Family Medicine

## 2015-01-17 MED ORDER — FENOFIBRATE 160 MG PO TABS: 160.0000 mg | ORAL_TABLET | Freq: Every day | ORAL | Status: DC

## 2015-01-17 NOTE — Telephone Encounter (Signed)
HER  NEW MEDICINE DAVID TOLD HER WAS BEING SENT TO HER PHARMACY IS NOT THERE.  [facility] [address] [facility] IS A MEDICINE FOR HER CHOLESTEROL

## 2015-01-17 NOTE — Telephone Encounter (Signed)
Med filled.  

## 2015-02-24 ENCOUNTER — Telehealth: Payer: Self-pay | Admitting: Family Medicine

## 2015-02-24 ENCOUNTER — Other Ambulatory Visit: Payer: Self-pay | Admitting: Family Medicine

## 2015-02-24 ENCOUNTER — Other Ambulatory Visit (INDEPENDENT_AMBULATORY_CARE_PROVIDER_SITE_OTHER): Payer: Managed Care, Other (non HMO)

## 2015-02-24 DIAGNOSIS — E785 Hyperlipidemia, unspecified: Secondary | ICD-10-CM | POA: Diagnosis not present

## 2015-02-24 DIAGNOSIS — R7989 Other specified abnormal findings of blood chemistry: Secondary | ICD-10-CM

## 2015-02-24 DIAGNOSIS — R945 Abnormal results of liver function studies: Principal | ICD-10-CM

## 2015-02-24 LAB — HEPATIC FUNCTION PANEL
ALT: 60 U/L — ABNORMAL HIGH (ref 0–35)
AST: 41 U/L — ABNORMAL HIGH (ref 0–37)
Albumin: 4.4 g/dL (ref 3.5–5.2)
Alkaline Phosphatase: 53 U/L (ref 39–117)
Bilirubin, Direct: 0.1 mg/dL (ref 0.0–0.3)
Total Bilirubin: 0.6 mg/dL (ref 0.2–1.2)
Total Protein: 7.5 g/dL (ref 6.0–8.3)

## 2015-02-24 NOTE — Telephone Encounter (Signed)
Spoke with pt and advised that if LFT's are elevated again in 2 weeks we will stop the medication. Pt was worried that there would be irreversible liver problems. Pt advised that this was not the case.

## 2015-02-24 NOTE — Telephone Encounter (Signed)
Relation to pt: self  Call back number: [phone] Pharmacy: [facility] [address] [place] - [address] [phone] (Phone) [phone] (Fax)         Reason for call:   Pt has a few more questions regarding fenofibrate 160 MG tablet and would like to discuss. Pt states she is apprehensive about the medication. Please advise

## 2015-03-17 ENCOUNTER — Other Ambulatory Visit (INDEPENDENT_AMBULATORY_CARE_PROVIDER_SITE_OTHER): Payer: Managed Care, Other (non HMO)

## 2015-03-17 ENCOUNTER — Other Ambulatory Visit: Payer: Managed Care, Other (non HMO)

## 2015-03-17 ENCOUNTER — Telehealth: Payer: Self-pay | Admitting: Family Medicine

## 2015-03-17 ENCOUNTER — Encounter: Payer: Self-pay | Admitting: General Practice

## 2015-03-17 DIAGNOSIS — R945 Abnormal results of liver function studies: Principal | ICD-10-CM

## 2015-03-17 DIAGNOSIS — R7989 Other specified abnormal findings of blood chemistry: Secondary | ICD-10-CM | POA: Diagnosis not present

## 2015-03-17 LAB — HEPATIC FUNCTION PANEL
ALT: 52 U/L — ABNORMAL HIGH (ref 0–35)
AST: 41 U/L — ABNORMAL HIGH (ref 0–37)
Albumin: 4.4 g/dL (ref 3.5–5.2)
Alkaline Phosphatase: 47 U/L (ref 39–117)
Bilirubin, Direct: 0.1 mg/dL (ref 0.0–0.3)
Total Bilirubin: 0.5 mg/dL (ref 0.2–1.2)
Total Protein: 7.5 g/dL (ref 6.0–8.3)

## 2015-03-17 NOTE — Telephone Encounter (Signed)
Caller name: [place] Relation to pt: Call back number: [phone] Pharmacy:  Reason for call:   Wants to know if she should continue taking her medications as directed.

## 2015-03-17 NOTE — Telephone Encounter (Signed)
Pt notified, expressed an understanding.

## 2015-04-28 ENCOUNTER — Telehealth: Payer: Self-pay | Admitting: Family Medicine

## 2015-04-28 NOTE — Telephone Encounter (Signed)
Pt can hold fenofibrate x2 weeks and see if itching improves.  If she would like to have the cough evaluated or has concerns for an upper respiratory infxn, she will need an appt

## 2015-04-28 NOTE — Telephone Encounter (Signed)
Can you please triage pt?

## 2015-04-28 NOTE — Telephone Encounter (Signed)
Patient states itching began 2 weeks ago (been on Fenofibrate 3 months).  She states there is no rash, nothing visible on skin, her skin is not dry/scaly.  She has been using the same soap and lotion for 5+ years.  She states that it feels "under the skin" instead of on it.  She also states she has a dry, scratchy cough x2 weeks.  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  She states that her grandson went to a water park in July and that is around the time symptoms started, but nothing visible on skin at that point either.    Please advise

## 2015-04-28 NOTE — Telephone Encounter (Signed)
Pt called stating that she has been taking fenofibrate 160 MG tablet for 3 months. She has had itching on and off since she started taking it. She said it's getting bothersome now and annoying. Please call her.

## 2015-04-28 NOTE — Telephone Encounter (Signed)
Patient stated understanding and agreed.  She will call office if cough does not resolve or if symptoms become more severe.

## 2015-06-20 LAB — HM MAMMOGRAPHY

## 2015-06-23 ENCOUNTER — Other Ambulatory Visit: Payer: Self-pay | Admitting: Gynecology

## 2015-06-27 LAB — CYTOLOGY - PAP

## 2015-07-20 ENCOUNTER — Encounter: Payer: Self-pay | Admitting: Family Medicine

## 2015-07-20 ENCOUNTER — Ambulatory Visit (INDEPENDENT_AMBULATORY_CARE_PROVIDER_SITE_OTHER): Payer: 59 | Admitting: Family Medicine

## 2015-07-20 VITALS — BP 122/84 | HR 93 | Temp 98.0°F | Resp 16 | Ht 68.0 in | Wt 180.1 lb

## 2015-07-20 DIAGNOSIS — E2839 Other primary ovarian failure: Secondary | ICD-10-CM

## 2015-07-20 DIAGNOSIS — Z23 Encounter for immunization: Secondary | ICD-10-CM

## 2015-07-20 DIAGNOSIS — Z0000 Encounter for general adult medical examination without abnormal findings: Secondary | ICD-10-CM | POA: Diagnosis not present

## 2015-07-20 DIAGNOSIS — Z01419 Encounter for gynecological examination (general) (routine) without abnormal findings: Secondary | ICD-10-CM

## 2015-07-20 DIAGNOSIS — R7989 Other specified abnormal findings of blood chemistry: Secondary | ICD-10-CM | POA: Diagnosis not present

## 2015-07-20 NOTE — Progress Notes (Signed)
Pre visit review using our clinic review tool, if applicable. No additional management support is needed unless otherwise documented below in the visit note. 

## 2015-07-20 NOTE — Patient Instructions (Signed)
Follow up in 6 months to recheck cholesterol We'll notify you of your lab results and make any changes if needed We'll call you with your GYN and bone density appts Please consider taking mom to [facility] at [address] [place], [place]  I am very concerned about her Keep up the good work on healthy diet and regular exercise Call with any questions or concerns If you want to join [place] at the new [place] office, any scheduled appointments will automatically transfer and we will see you at [address] [place] [address] [place], [place], [place] [address]  Hang in there!!!

## 2015-07-20 NOTE — Progress Notes (Signed)
   Subjective:    Patient ID: Melissa Miles, female    DOB: 1956/01/14, 59 y.o.   MRN: [id number]  HPI CPE- UTD on GYN (pap, mammo done w/ Dr Carren Rang but he has retired so she needs referral to new provider).  UTD on colonoscopy- Dr Fuller Plan.  Due for Dexa.   Review of Systems Patient reports no vision/ hearing changes, adenopathy,fever, weight change,  persistant/recurrent hoarseness , swallowing issues, chest pain, palpitations, edema, persistant/recurrent cough, hemoptysis, dyspnea (rest/exertional/paroxysmal nocturnal), gastrointestinal bleeding (melena, rectal bleeding), abdominal pain, significant heartburn, bowel changes, GU symptoms (dysuria, hematuria, incontinence), Gyn symptoms (abnormal  bleeding, pain),  syncope, focal weakness, memory loss, numbness & tingling, skin/hair/nail changes, abnormal bruising or bleeding, anxiety, or depression.     Objective:   Physical Exam General Appearance:    Alert, cooperative, no distress, appears stated age  Head:    Normocephalic, without obvious abnormality, atraumatic  Eyes:    PERRL, conjunctiva/corneas clear, EOM's intact, fundi    benign, both eyes  Ears:    Normal TM's and external ear canals, both ears  Nose:   Nares normal, septum midline, mucosa normal, no drainage    or sinus tenderness  Throat:   Lips, mucosa, and tongue normal; teeth and gums normal  Neck:   Supple, symmetrical, trachea midline, no adenopathy;    Thyroid: no enlargement/tenderness/nodules  Back:     Symmetric, no curvature, ROM normal, no CVA tenderness  Lungs:     Clear to auscultation bilaterally, respirations unlabored  Chest Wall:    No tenderness or deformity   Heart:    Regular rate and rhythm, S1 and S2 normal, no murmur, rub   or gallop  Breast Exam:    Deferred to GYN  Abdomen:     Soft, non-tender, bowel sounds active all four quadrants,    no masses, no organomegaly  Genitalia:    Deferred to GYN  Rectal:    Extremities:   Extremities normal,  atraumatic, no cyanosis or edema  Pulses:   2+ and symmetric all extremities  Skin:   Skin color, texture, turgor normal, no rashes or lesions  Lymph nodes:   Cervical, supraclavicular, and axillary nodes normal  Neurologic:   CNII-XII intact, normal strength, sensation and reflexes    throughout          Assessment & Plan:

## 2015-07-21 LAB — LIPID PANEL
Cholesterol: 206 mg/dL — ABNORMAL HIGH (ref 0–200)
HDL: 41.5 mg/dL (ref >39.00)
NonHDL: 164.08
Total CHOL/HDL Ratio: 5
Triglycerides: 222 mg/dL — ABNORMAL HIGH (ref 0.0–149.0)
VLDL: 44.4 mg/dL — ABNORMAL HIGH (ref 0.0–40.0)

## 2015-07-21 LAB — BASIC METABOLIC PANEL
BUN: 24 mg/dL — ABNORMAL HIGH (ref 6–23)
CO2: 22 mEq/L (ref 19–32)
Calcium: 10.7 mg/dL — ABNORMAL HIGH (ref 8.4–10.5)
Chloride: 105 mEq/L (ref 96–112)
Creatinine, Ser: 0.82 mg/dL (ref 0.40–1.20)
GFR: 76.03 mL/min (ref >60.00)
Glucose, Bld: 85 mg/dL (ref 70–99)
Potassium: 4 mEq/L (ref 3.5–5.1)
Sodium: 144 mEq/L (ref 135–145)

## 2015-07-21 LAB — CBC WITH DIFFERENTIAL/PLATELET
Basophils Absolute: 0.1 10*3/uL (ref 0.0–0.1)
Basophils Relative: 1.8 % (ref 0.0–3.0)
Eosinophils Absolute: 0.2 10*3/uL (ref 0.0–0.7)
Eosinophils Relative: 2.1 % (ref 0.0–5.0)
HCT: 40.2 % (ref 36.0–46.0)
Hemoglobin: 13.5 g/dL (ref 12.0–15.0)
Lymphocytes Relative: 35.8 % (ref 12.0–46.0)
Lymphs Abs: 2.6 10*3/uL (ref 0.7–4.0)
MCHC: 33.5 g/dL (ref 30.0–36.0)
MCV: 90.8 fl (ref 78.0–100.0)
Monocytes Absolute: 0.4 10*3/uL (ref 0.1–1.0)
Monocytes Relative: 5.9 % (ref 3.0–12.0)
Neutro Abs: 4 10*3/uL (ref 1.4–7.7)
Neutrophils Relative %: 54.4 % (ref 43.0–77.0)
Platelets: 222 10*3/uL (ref 150.0–400.0)
RBC: 4.43 Mil/uL (ref 3.87–5.11)
RDW: 13.1 % (ref 11.5–15.5)
WBC: 7.4 10*3/uL (ref 4.0–10.5)

## 2015-07-21 LAB — TSH: TSH: 1.16 u[IU]/mL (ref 0.35–4.50)

## 2015-07-21 LAB — VITAMIN D 25 HYDROXY (VIT D DEFICIENCY, FRACTURES): VITD: 69.64 ng/mL (ref 30.00–100.00)

## 2015-07-21 LAB — HEPATIC FUNCTION PANEL
ALT: 36 U/L — ABNORMAL HIGH (ref 0–35)
AST: 37 U/L (ref 0–37)
Albumin: 4.4 g/dL (ref 3.5–5.2)
Alkaline Phosphatase: 61 U/L (ref 39–117)
Bilirubin, Direct: 0.1 mg/dL (ref 0.0–0.3)
Total Bilirubin: 0.7 mg/dL (ref 0.2–1.2)
Total Protein: 7.9 g/dL (ref 6.0–8.3)

## 2015-07-21 LAB — LDL CHOLESTEROL, DIRECT: Direct LDL: 129 mg/dL

## 2015-07-21 NOTE — Assessment & Plan Note (Signed)
Pt's PE WNL.  She has lost 20 lbs since last visit- applauded her efforts.  UTD on GYN, colonoscopy.  Due for DEXA- order entered.  Flu shot given.  Check labs.  Anticipatory guidance provided.

## 2015-07-22 ENCOUNTER — Other Ambulatory Visit: Payer: Self-pay | Admitting: Family Medicine

## 2015-07-22 ENCOUNTER — Encounter: Payer: Self-pay | Admitting: General Practice

## 2015-07-22 NOTE — Telephone Encounter (Signed)
Medication filled to pharmacy as requested.   

## 2015-07-29 ENCOUNTER — Other Ambulatory Visit: Payer: 59

## 2015-08-01 ENCOUNTER — Encounter: Payer: Self-pay | Admitting: General Practice

## 2015-08-01 LAB — PTH, INTACT AND CALCIUM
Calcium: 10.1 mg/dL (ref 8.4–10.5)
PTH: 53 pg/mL (ref 14–64)

## 2015-09-25 ENCOUNTER — Other Ambulatory Visit: Payer: Self-pay | Admitting: Family Medicine

## 2015-09-27 ENCOUNTER — Telehealth: Payer: Self-pay | Admitting: Family Medicine

## 2015-09-27 MED ORDER — ATORVASTATIN CALCIUM 40 MG PO TABS: 40.0000 mg | ORAL_TABLET | Freq: Every day | ORAL | Status: DC

## 2015-09-27 NOTE — Telephone Encounter (Signed)
Medication filled to pharmacy as requested.   

## 2015-09-27 NOTE — Telephone Encounter (Signed)
Caller name: Self   Can be reached: [phone]  Pharmacy:  [facility] [address] [place] - [address] [phone] (Phone) [phone] (Fax)         Reason for call: Request refill on atorvastatin (LIPITOR) 40 MG tablet [id number]

## 2015-10-25 ENCOUNTER — Other Ambulatory Visit: Payer: Self-pay | Admitting: Family Medicine

## 2015-10-25 NOTE — Telephone Encounter (Signed)
Medication filled to pharmacy as requested.   

## 2015-10-28 ENCOUNTER — Encounter: Payer: Self-pay | Admitting: Gastroenterology

## 2016-01-18 ENCOUNTER — Encounter: Payer: Self-pay | Admitting: Family Medicine

## 2016-01-18 ENCOUNTER — Ambulatory Visit (INDEPENDENT_AMBULATORY_CARE_PROVIDER_SITE_OTHER): Payer: 59 | Admitting: Family Medicine

## 2016-01-18 VITALS — BP 130/80 | HR 109 | Temp 98.1°F | Resp 17 | Wt 185.4 lb

## 2016-01-18 DIAGNOSIS — E785 Hyperlipidemia, unspecified: Secondary | ICD-10-CM | POA: Diagnosis not present

## 2016-01-18 LAB — HEPATIC FUNCTION PANEL
ALT: 34 U/L — ABNORMAL HIGH (ref 6–29)
AST: 31 U/L (ref 10–35)
Albumin: 4.2 g/dL (ref 3.6–5.1)
Alkaline Phosphatase: 65 U/L (ref 33–130)
Bilirubin, Direct: 0.1 mg/dL (ref <=0.2)
Indirect Bilirubin: 0.8 mg/dL (ref 0.2–1.2)
Total Bilirubin: 0.9 mg/dL (ref 0.2–1.2)
Total Protein: 7.6 g/dL (ref 6.1–8.1)

## 2016-01-18 LAB — BASIC METABOLIC PANEL
BUN: 22 mg/dL (ref 7–25)
CO2: 26 mmol/L (ref 20–31)
Calcium: 10.6 mg/dL — ABNORMAL HIGH (ref 8.6–10.4)
Chloride: 103 mmol/L (ref 98–110)
Creat: 0.87 mg/dL (ref 0.50–1.05)
Glucose, Bld: 85 mg/dL (ref 65–99)
Potassium: 3.9 mmol/L (ref 3.5–5.3)
Sodium: 140 mmol/L (ref 135–146)

## 2016-01-18 LAB — LIPID PANEL
Cholesterol: 187 mg/dL (ref 125–200)
HDL: 45 mg/dL — ABNORMAL LOW (ref >=46)
LDL Cholesterol: 113 mg/dL (ref <130)
Total CHOL/HDL Ratio: 4.2 Ratio (ref <=5.0)
Triglycerides: 147 mg/dL (ref <150)
VLDL: 29 mg/dL (ref <30)

## 2016-01-18 NOTE — Patient Instructions (Signed)
Schedule your complete physical in 6 months We'll notify you of your lab results and make any changes Keep up the good work on healthy diet and regular exercise- you can do it!! Call with any questions or concerns Thanks for sticking with [place]!! Happy Spring!!!

## 2016-01-18 NOTE — Progress Notes (Signed)
Pre visit review using our clinic review tool, if applicable. No additional management support is needed unless otherwise documented below in the visit note. 

## 2016-01-18 NOTE — Progress Notes (Signed)
   Subjective:    Patient ID: Melissa Miles, female    DOB: 07-Oct-1955, 60 y.o.   MRN: [id number]  HPI Hyperlipidemia- chronic problem, on Lipitor.  Not taking Fenofibrate due to itching.  Pt has gained 5 lbs since last visit.  Pt reports 'feeling really good' but admits to poor dietary choices when son was home from [place] for 3 weeks.  Denies CP, SOB, HAs, visual changes, edema, abd pain, N/V.  Limited exercise.   Review of Systems For ROS see HPI     Objective:   Physical Exam  Constitutional: She is oriented to person, place, and time. She appears well-developed and well-nourished. No distress.  HENT:  Head: Normocephalic and atraumatic.  Eyes: Conjunctivae and EOM are normal. Pupils are equal, round, and reactive to light.  Neck: Normal range of motion. Neck supple. No thyromegaly present.  Cardiovascular: Normal rate, regular rhythm, normal heart sounds and intact distal pulses.   No murmur heard. Pulmonary/Chest: Effort normal and breath sounds normal. No respiratory distress.  Abdominal: Soft. She exhibits no distension. There is no tenderness.  Musculoskeletal: She exhibits no edema.  Lymphadenopathy:    She has no cervical adenopathy.  Neurological: She is alert and oriented to person, place, and time.  Skin: Skin is warm and dry.  Psychiatric: She has a normal mood and affect. Her behavior is normal.  Vitals reviewed.         Assessment & Plan:

## 2016-01-18 NOTE — Assessment & Plan Note (Signed)
Chronic problem.  Tolerating Lipitor w/o difficulty.  Not taking fenofibrate due to itching.  Pt had been losing weight but has since regained.  Encouraged healthy diet and regular exercise.  Check labs.  Adjust meds prn.

## 2016-01-19 ENCOUNTER — Encounter: Payer: Self-pay | Admitting: General Practice

## 2016-01-27 ENCOUNTER — Encounter: Payer: Self-pay | Admitting: General Practice

## 2016-01-27 ENCOUNTER — Ambulatory Visit
Admission: RE | Admit: 2016-01-27 | Discharge: 2016-01-27 | Disposition: A | Discharge status: Home / Self Care | Payer: 59 | Source: Ambulatory Visit | Attending: Family Medicine | Admitting: Family Medicine

## 2016-01-27 DIAGNOSIS — E2839 Other primary ovarian failure: Secondary | ICD-10-CM

## 2016-01-29 ENCOUNTER — Other Ambulatory Visit: Payer: Self-pay | Admitting: Family Medicine

## 2016-01-30 NOTE — Telephone Encounter (Signed)
Medication filled to pharmacy as requested.   

## 2016-07-18 ENCOUNTER — Encounter: Payer: Self-pay | Admitting: Family Medicine

## 2016-07-18 ENCOUNTER — Ambulatory Visit (INDEPENDENT_AMBULATORY_CARE_PROVIDER_SITE_OTHER): Payer: 59 | Admitting: Family Medicine

## 2016-07-18 VITALS — BP 122/82 | HR 90 | Temp 98.2°F | Resp 16 | Ht 68.0 in | Wt 194.4 lb

## 2016-07-18 DIAGNOSIS — Z01419 Encounter for gynecological examination (general) (routine) without abnormal findings: Secondary | ICD-10-CM | POA: Diagnosis not present

## 2016-07-18 DIAGNOSIS — Z23 Encounter for immunization: Secondary | ICD-10-CM | POA: Diagnosis not present

## 2016-07-18 DIAGNOSIS — Z0000 Encounter for general adult medical examination without abnormal findings: Secondary | ICD-10-CM

## 2016-07-18 LAB — HEPATIC FUNCTION PANEL
ALT: 39 U/L — ABNORMAL HIGH (ref 6–29)
AST: 36 U/L — ABNORMAL HIGH (ref 10–35)
Albumin: 4.1 g/dL (ref 3.6–5.1)
Alkaline Phosphatase: 62 U/L (ref 33–130)
Bilirubin, Direct: 0.1 mg/dL (ref <=0.2)
Indirect Bilirubin: 0.6 mg/dL (ref 0.2–1.2)
Total Bilirubin: 0.7 mg/dL (ref 0.2–1.2)
Total Protein: 7 g/dL (ref 6.1–8.1)

## 2016-07-18 LAB — LIPID PANEL
Cholesterol: 193 mg/dL (ref 125–200)
HDL: 37 mg/dL — ABNORMAL LOW (ref >=46)
LDL Cholesterol: 95 mg/dL (ref <130)
Total CHOL/HDL Ratio: 5.2 Ratio — ABNORMAL HIGH (ref <=5.0)
Triglycerides: 303 mg/dL — ABNORMAL HIGH (ref <150)
VLDL: 61 mg/dL — ABNORMAL HIGH (ref <30)

## 2016-07-18 LAB — CBC WITH DIFFERENTIAL/PLATELET
Basophils Absolute: 0 cells/uL (ref 0–200)
Basophils Relative: 0 %
Eosinophils Absolute: 276 cells/uL (ref 15–500)
Eosinophils Relative: 3 %
HCT: 40.7 % (ref 35.0–45.0)
Hemoglobin: 13.6 g/dL (ref 11.7–15.5)
Lymphocytes Relative: 28 %
Lymphs Abs: 2576 cells/uL (ref 850–3900)
MCH: 30.2 pg (ref 27.0–33.0)
MCHC: 33.4 g/dL (ref 32.0–36.0)
MCV: 90.2 fL (ref 80.0–100.0)
MPV: 10.6 fL (ref 7.5–12.5)
Monocytes Absolute: 644 cells/uL (ref 200–950)
Monocytes Relative: 7 %
Neutro Abs: 5704 cells/uL (ref 1500–7800)
Neutrophils Relative %: 62 %
Platelets: 235 10*3/uL (ref 140–400)
RBC: 4.51 MIL/uL (ref 3.80–5.10)
RDW: 13.2 % (ref 11.0–15.0)
WBC: 9.2 10*3/uL (ref 3.8–10.8)

## 2016-07-18 LAB — BASIC METABOLIC PANEL
BUN: 17 mg/dL (ref 7–25)
CO2: 26 mmol/L (ref 20–31)
Calcium: 10.3 mg/dL (ref 8.6–10.4)
Chloride: 103 mmol/L (ref 98–110)
Creat: 0.8 mg/dL (ref 0.50–1.05)
Glucose, Bld: 87 mg/dL (ref 65–99)
Potassium: 4.1 mmol/L (ref 3.5–5.3)
Sodium: 139 mmol/L (ref 135–146)

## 2016-07-18 LAB — TSH: TSH: 1.01 mIU/L

## 2016-07-18 NOTE — Progress Notes (Signed)
   Subjective:    Patient ID: Melissa Miles, female    DOB: [birth year], 60 y.o.   MRN: [id number]  HPI CPE- UTD on colonoscopy (due 2020), pap (due 2019).  Mammogram is due.  UTD on Tdap.  Will get flu today.  Pt has gained 10 lbs since last visit- not exercising and admits to poor eating.  Needs new GYN referral- prefers female provider.   Review of Systems Patient reports no vision/ hearing changes, adenopathy,fever, weight change,  persistant/recurrent hoarseness , swallowing issues, chest pain, palpitations, edema, persistant/recurrent cough, hemoptysis, dyspnea (rest/exertional/paroxysmal nocturnal), gastrointestinal bleeding (melena, rectal bleeding), abdominal pain, significant heartburn, bowel changes, GU symptoms (dysuria, hematuria, incontinence), Gyn symptoms (abnormal  bleeding, pain),  syncope, focal weakness, memory loss, numbness & tingling, skin/hair/nail changes, abnormal bruising or bleeding, anxiety, or depression.     Objective:   Physical Exam General Appearance:    Alert, cooperative, no distress, appears stated age  Head:    Normocephalic, without obvious abnormality, atraumatic  Eyes:    PERRL, conjunctiva/corneas clear, EOM's intact, fundi    benign, both eyes  Ears:    Normal TM's and external ear canals, both ears  Nose:   Nares normal, septum midline, mucosa normal, no drainage    or sinus tenderness  Throat:   Lips, mucosa, and tongue normal; teeth and gums normal  Neck:   Supple, symmetrical, trachea midline, no adenopathy;    Thyroid: no enlargement/tenderness/nodules  Back:     Symmetric, no curvature, ROM normal, no CVA tenderness  Lungs:     Clear to auscultation bilaterally, respirations unlabored  Chest Wall:    No tenderness or deformity   Heart:    Regular rate and rhythm, S1 and S2 normal, no murmur, rub   or gallop  Breast Exam:    Deferred to GYN  Abdomen:     Soft, non-tender, bowel sounds active all four quadrants,    no masses, no  organomegaly  Genitalia:    Deferred to GYN  Rectal:    Extremities:   Extremities normal, atraumatic, no cyanosis or edema  Pulses:   2+ and symmetric all extremities  Skin:   Skin color, texture, turgor normal, no rashes or lesions  Lymph nodes:   Cervical, supraclavicular, and axillary nodes normal  Neurologic:   CNII-XII intact, normal strength, sensation and reflexes    throughout          Assessment & Plan:

## 2016-07-18 NOTE — Assessment & Plan Note (Signed)
Pt's PE WNL w/ exception of being overweight.  UTD on pap, due for mammo.  UTD on colonoscopy.  Check labs.  Anticipatory guidance provided.

## 2016-07-18 NOTE — Progress Notes (Signed)
Pre visit review using our clinic review tool, if applicable. No additional management support is needed unless otherwise documented below in the visit note. 

## 2016-07-18 NOTE — Patient Instructions (Addendum)
Follow up in 6 months to recheck cholesterol We'll notify you of your lab results and make any changes if needed Try and work on healthy diet and regular exercise- you can do it!!! We'll call you with your GYN appt Call with any questions or concerns Happy Fall!!!

## 2016-07-19 ENCOUNTER — Other Ambulatory Visit: Payer: Self-pay | Admitting: General Practice

## 2016-07-19 LAB — VITAMIN D 25 HYDROXY (VIT D DEFICIENCY, FRACTURES): Vit D, 25-Hydroxy: 47 ng/mL (ref 30–100)

## 2016-07-19 MED ORDER — ICOSAPENT ETHYL 1 G PO CAPS: 2.0000 | ORAL_CAPSULE | Freq: Two times a day (BID) | ORAL | 3 refills | Status: DC

## 2016-07-19 NOTE — Progress Notes (Signed)
Called pt and lmovm to return call.

## 2016-07-20 ENCOUNTER — Telehealth: Payer: Self-pay | Admitting: Emergency Medicine

## 2016-07-20 DIAGNOSIS — E785 Hyperlipidemia, unspecified: Secondary | ICD-10-CM

## 2016-07-20 NOTE — Telephone Encounter (Signed)
Once prior authorization is determined, we will see if we need to prescribe an alternative medication.  If prior Josem Kaufmann is denied, we can start Gemfibrozil 600mg  BID 30 minutes prior to meals

## 2016-07-20 NOTE — Telephone Encounter (Signed)
I notified patient that we are waiting on response from insurance co for Pa. If medication is denied will change medication to recommend medication per Kt. Pt is agreeable

## 2016-07-20 NOTE — Telephone Encounter (Signed)
Patient called stating the new rx Vascepa is going to cost her $300 at the pharmacy. The pharmacy did send a PA for the medication. We are waiting on the response from the insurance co. Patient states she can't afford the $300 a month for the medication. She would like to work on diet and exercise alone or other suggestions. Please advise

## 2016-07-23 NOTE — Telephone Encounter (Signed)
Called insurance co on process of Pa. The PA has gone to [employer] for review.

## 2016-07-25 ENCOUNTER — Encounter: Payer: 59 | Admitting: Family Medicine

## 2016-07-25 NOTE — Telephone Encounter (Signed)
Called again to check on PA process. Currently under MD review of medication. Does not have any notes requesting additional information. Will check back in 2 more days.

## 2016-07-27 MED ORDER — GEMFIBROZIL 600 MG PO TABS: 600.0000 mg | ORAL_TABLET | Freq: Two times a day (BID) | ORAL | 3 refills | Status: DC

## 2016-07-27 NOTE — Addendum Note (Signed)
Addended by: Leonidas Romberg on: 07/27/2016 04:32 PM   Modules accepted: Orders

## 2016-07-27 NOTE — Telephone Encounter (Signed)
Just received the denial from the [facility] for the [place]. Reason was patient did not meet severe hypertriglyceridemia <500. Sent in Gemfibrozil 600 mg bid to the [employer].

## 2016-08-03 ENCOUNTER — Other Ambulatory Visit: Payer: Self-pay | Admitting: Family Medicine

## 2016-08-22 ENCOUNTER — Telehealth: Payer: Self-pay

## 2016-08-22 NOTE — Telephone Encounter (Signed)
Left pt a message to call office to schedule appt for annual exam

## 2016-08-29 ENCOUNTER — Ambulatory Visit (INDEPENDENT_AMBULATORY_CARE_PROVIDER_SITE_OTHER): Payer: 59 | Admitting: Physician Assistant

## 2016-08-29 ENCOUNTER — Encounter: Payer: Self-pay | Admitting: Physician Assistant

## 2016-08-29 VITALS — BP 118/78 | HR 89 | Temp 98.5°F | Resp 16 | Ht 68.0 in | Wt 180.0 lb

## 2016-08-29 DIAGNOSIS — R102 Pelvic and perineal pain: Secondary | ICD-10-CM | POA: Diagnosis not present

## 2016-08-29 LAB — POCT URINALYSIS DIPSTICK
Bilirubin, UA: NEGATIVE
Glucose, UA: NEGATIVE
Ketones, UA: NEGATIVE
Leukocytes, UA: NEGATIVE
Nitrite, UA: NEGATIVE
Protein, UA: NEGATIVE
Spec Grav, UA: 1.025
Urobilinogen, UA: 0.2
pH, UA: 6

## 2016-08-29 NOTE — Progress Notes (Signed)
Pre visit review using our clinic review tool, if applicable. No additional management support is needed unless otherwise documented below in the visit note. 

## 2016-08-29 NOTE — Progress Notes (Signed)
Patient presents to clinic today c/o 1 days of suprapubic pressure and urinary frequency/urgency without dysuria. Denies hematuria. Denies fever, chills, nausea/vomiting, flank pain/back pain. Denies vaginal symptoms.   Past Medical History:  Diagnosis Date  . Hyperlipidemia   . IBS (irritable bowel syndrome)    Dr Fuller Plan    Current Outpatient Prescriptions on File Prior to Visit  Medication Sig Dispense Refill  . aspirin 81 MG tablet Take 81 mg by mouth daily.    Marland Kitchen atorvastatin (LIPITOR) 40 MG tablet Take 1 tablet (40 mg total) by mouth daily. 90 tablet 1  . Calcium Carbonate-Vit D-Min (CALTRATE PLUS PO) Take 1,200 mg by mouth daily.     . Cholecalciferol (VITAMIN D3) 1000 UNITS CAPS Take by mouth daily.    . DULoxetine (CYMBALTA) 60 MG capsule TAKE ONE CAPSULE BY MOUTH ONCE DAILY 90 capsule 1  . gemfibrozil (LOPID) 600 MG tablet Take 1 tablet (600 mg total) by mouth 2 (two) times daily before a meal. 60 tablet 3  . Multiple Vitamin (MULTIVITAMIN) tablet Take 1 tablet by mouth daily.     No current facility-administered medications on file prior to visit.     No Known Allergies  Family History  Problem Relation Age of Onset  . Mental illness      half sister had  ? bipolar  . Heart attack  54    half bro   . Cancer Father     NHL  . Osteoporosis Mother   . Thyroid disease      half bro , hypothyroidism    Social History   Social History  . Marital status: Married    Spouse name: N/A  . Number of children: N/A  . Years of education: N/A   Social History Main Topics  . Smoking status: Never Smoker  . Smokeless tobacco: Never Used  . Alcohol use No  . Drug use: No  . Sexual activity: Not Asked   Other Topics Concern  . None   Social History Narrative  . None    Review of Systems - See HPI.  All other ROS are negative.  BP 118/78   Pulse 89   Temp 98.5 F (36.9 C) (Oral)   Resp 16   Ht 5' 8"  (1.727 m)   Wt 180 lb (81.6 kg)   SpO2 98%   BMI 27.37  kg/m   Physical Exam  Constitutional: She is oriented to person, place, and time and well-developed, well-nourished, and in no distress.  HENT:  Head: Normocephalic and atraumatic.  Eyes: Conjunctivae are normal.  Neck: Neck supple.  Cardiovascular: Normal rate, regular rhythm, normal heart sounds and intact distal pulses.   Pulmonary/Chest: Effort normal and breath sounds normal. No respiratory distress. She has no wheezes. She has no rales. She exhibits no tenderness.  Abdominal: Soft. Bowel sounds are normal. She exhibits no distension. There is no tenderness.  Negative CVA tenderness.   Neurological: She is alert and oriented to person, place, and time.  Skin: Skin is warm and dry. No rash noted.  Psychiatric: Affect normal.  Vitals reviewed.  Recent Results (from the past 2160 hour(s))  Lipid panel     Status: Abnormal   Collection Time: 07/18/16  2:50 PM  Result Value Ref Range   Cholesterol 193 125 - 200 mg/dL   Triglycerides 303 (H) <150 mg/dL   HDL 37 (L) >=46 mg/dL   Total CHOL/HDL Ratio 5.2 (H) <=5.0 Ratio   VLDL 61 (H) <30 mg/dL  LDL Cholesterol 95 <130 mg/dL    Comment:   Total Cholesterol/HDL Ratio:CHD Risk                        Coronary Heart Disease Risk Table                                        Men       Women          1/2 Average Risk              3.4        3.3              Average Risk              5.0        4.4           2X Average Risk              9.6        7.1           3X Average Risk             23.4       11.0 Use the calculated Patient Ratio above and the CHD Risk table  to determine the patient's CHD Risk.   Basic metabolic panel     Status: None   Collection Time: 07/18/16  2:50 PM  Result Value Ref Range   Sodium 139 135 - 146 mmol/L   Potassium 4.1 3.5 - 5.3 mmol/L   Chloride 103 98 - 110 mmol/L   CO2 26 20 - 31 mmol/L   Glucose, Bld 87 65 - 99 mg/dL   BUN 17 7 - 25 mg/dL   Creat 0.80 0.50 - 1.05 mg/dL    Comment:   For patients  > or = 59 years of age: The upper reference limit for Creatinine is approximately 13% higher for people identified as African-American.      Calcium 10.3 8.6 - 10.4 mg/dL  TSH     Status: None   Collection Time: 07/18/16  2:50 PM  Result Value Ref Range   TSH 1.01 mIU/L    Comment:   Reference Range   > or = 20 Years  0.40-4.50   Pregnancy Range First trimester  0.26-2.66 Second trimester 0.55-2.73 Third trimester  0.43-2.91     Hepatic function panel     Status: Abnormal   Collection Time: 07/18/16  2:50 PM  Result Value Ref Range   Total Bilirubin 0.7 0.2 - 1.2 mg/dL   Bilirubin, Direct 0.1 <=0.2 mg/dL   Indirect Bilirubin 0.6 0.2 - 1.2 mg/dL   Alkaline Phosphatase 62 33 - 130 U/L   AST 36 (H) 10 - 35 U/L   ALT 39 (H) 6 - 29 U/L   Total Protein 7.0 6.1 - 8.1 g/dL   Albumin 4.1 3.6 - 5.1 g/dL  CBC with Differential/Platelet     Status: None   Collection Time: 07/18/16  2:50 PM  Result Value Ref Range   WBC 9.2 3.8 - 10.8 K/uL   RBC 4.51 3.80 - 5.10 MIL/uL   Hemoglobin 13.6 11.7 - 15.5 g/dL   HCT 40.7 35.0 - 45.0 %   MCV 90.2 80.0 - 100.0 fL   MCH 30.2 27.0 - 33.0 pg   MCHC 33.4 32.0 -  36.0 g/dL   RDW 13.2 11.0 - 15.0 %   Platelets 235 140 - 400 K/uL   MPV 10.6 7.5 - 12.5 fL   Neutro Abs 5,704 1,500 - 7,800 cells/uL   Lymphs Abs 2,576 850 - 3,900 cells/uL   Monocytes Absolute 644 200 - 950 cells/uL   Eosinophils Absolute 276 15 - 500 cells/uL   Basophils Absolute 0 0 - 200 cells/uL   Neutrophils Relative % 62 %   Lymphocytes Relative 28 %   Monocytes Relative 7 %   Eosinophils Relative 3 %   Basophils Relative 0 %   Smear Review Criteria for review not met   VITAMIN D 25 Hydroxy (Vit-D Deficiency, Fractures)     Status: None   Collection Time: 07/18/16  2:50 PM  Result Value Ref Range   Vit D, 25-Hydroxy 47 30 - 100 ng/mL    Comment: Vitamin D Status           25-OH Vitamin D        Deficiency                <20 ng/mL        Insufficiency         20 - 29  ng/mL        Optimal             > or = 30 ng/mL   For 25-OH Vitamin D testing on patients on D2-supplementation and patients for whom quantitation of D2 and D3 fractions is required, the [facility] 25-OH VIT D, (D2,D3), LC/MS/MS is recommended: order code [phone] (patients > 2 yrs).   POCT Urinalysis Dipstick     Status: Abnormal   Collection Time: 08/29/16  4:00 PM  Result Value Ref Range   Color, UA Yellow    Clarity, UA Cloudy    Glucose, UA Negative    Bilirubin, UA Negative    Ketones, UA Negative    Spec Grav, UA 1.025    Blood, UA 3+    pH, UA 6.0    Protein, UA Negative    Urobilinogen, UA 0.2    Nitrite, UA Negatuve    Leukocytes, UA Negative Negative    Assessment/Plan: 1. Suprapubic pressure Urine dip with 3+ hemoglobin. No nitrites or LE. Will send for micro and culture. Supportive measures reviewed. Will hold off on ABX pending micro and culture. If blood noted on micro without infection on culture, will need further assessment.   - POCT Urinalysis Dipstick - Urine Microscopic Only - CULTURE, URINE COMPREHENSIVE   Leeanne Rio, PA-C

## 2016-08-29 NOTE — Patient Instructions (Signed)
Please stay well-hydrated and get plenty of rest.  Start a cranberry supplement or probiotic.  Monitor for any new or worsening symptoms.  I am sending urine to look at under the microscope and for a culture. We will treat based on results.

## 2016-08-30 LAB — URINALYSIS, MICROSCOPIC ONLY

## 2016-08-31 LAB — CULTURE, URINE COMPREHENSIVE

## 2016-09-01 ENCOUNTER — Other Ambulatory Visit: Payer: Self-pay | Admitting: Physician Assistant

## 2016-09-01 MED ORDER — CIPROFLOXACIN HCL 250 MG PO TABS: 250.0000 mg | ORAL_TABLET | Freq: Two times a day (BID) | ORAL | 0 refills | Status: DC

## 2016-09-03 ENCOUNTER — Telehealth: Payer: Self-pay | Admitting: Physician Assistant

## 2016-09-03 NOTE — Telephone Encounter (Signed)
LMOVM of any persistent urinary symptoms.

## 2016-09-03 NOTE — Telephone Encounter (Signed)
Please call patient to assess how she is feeling. Spoke with her Saturday with culture results but noted that all symptoms had resolved. Please assess for any recurrence that would prompt need for antibiotic.

## 2016-09-03 NOTE — Telephone Encounter (Signed)
-----   Message from Brunetta Jeans, PA-C sent at 09/01/2016 10:04 AM EST ----- Call Monday -- assess urinary symptoms.

## 2016-09-04 NOTE — Telephone Encounter (Signed)
Spoke with patient and she states her urinary symptoms comes and goes but she has increased her water intake and cranberry juice. Her husband picked up the abx rx but she has not started medication.

## 2016-09-05 NOTE — Telephone Encounter (Signed)
If she is having recurrence of symptoms, she should start antibiotic and take as directed.

## 2016-09-07 NOTE — Telephone Encounter (Signed)
Patient states she is not having any urinary symptoms. Her symptoms improved with increased water intake and cranberry juice.

## 2016-11-23 ENCOUNTER — Other Ambulatory Visit: Payer: Self-pay | Admitting: Family Medicine

## 2016-11-29 ENCOUNTER — Other Ambulatory Visit: Payer: Self-pay | Admitting: Family Medicine

## 2016-11-29 DIAGNOSIS — E785 Hyperlipidemia, unspecified: Secondary | ICD-10-CM

## 2017-01-16 ENCOUNTER — Encounter: Payer: Self-pay | Admitting: Family Medicine

## 2017-01-16 ENCOUNTER — Ambulatory Visit (INDEPENDENT_AMBULATORY_CARE_PROVIDER_SITE_OTHER): Payer: 59 | Admitting: Family Medicine

## 2017-01-16 VITALS — BP 121/81 | HR 98 | Temp 98.6°F | Resp 18 | Ht 68.0 in | Wt 185.0 lb

## 2017-01-16 DIAGNOSIS — E663 Overweight: Secondary | ICD-10-CM | POA: Diagnosis not present

## 2017-01-16 DIAGNOSIS — E785 Hyperlipidemia, unspecified: Secondary | ICD-10-CM | POA: Diagnosis not present

## 2017-01-16 LAB — LIPID PANEL
Cholesterol: 197 mg/dL (ref <200)
HDL: 52 mg/dL (ref >50)
LDL Cholesterol: 124 mg/dL — ABNORMAL HIGH (ref <100)
Total CHOL/HDL Ratio: 3.8 Ratio (ref <5.0)
Triglycerides: 103 mg/dL (ref <150)
VLDL: 21 mg/dL (ref <30)

## 2017-01-16 LAB — HEPATIC FUNCTION PANEL
ALT: 35 U/L — ABNORMAL HIGH (ref 6–29)
AST: 34 U/L (ref 10–35)
Albumin: 4.7 g/dL (ref 3.6–5.1)
Alkaline Phosphatase: 54 U/L (ref 33–130)
Bilirubin, Direct: 0.1 mg/dL (ref <=0.2)
Indirect Bilirubin: 0.7 mg/dL (ref 0.2–1.2)
Total Bilirubin: 0.8 mg/dL (ref 0.2–1.2)
Total Protein: 7.6 g/dL (ref 6.1–8.1)

## 2017-01-16 LAB — BASIC METABOLIC PANEL
BUN: 25 mg/dL (ref 7–25)
CO2: 23 mmol/L (ref 20–31)
Calcium: 11.5 mg/dL — ABNORMAL HIGH (ref 8.6–10.4)
Chloride: 102 mmol/L (ref 98–110)
Creat: 0.85 mg/dL (ref 0.50–1.05)
Glucose, Bld: 90 mg/dL (ref 65–99)
Potassium: 4.3 mmol/L (ref 3.5–5.3)
Sodium: 139 mmol/L (ref 135–146)

## 2017-01-16 NOTE — Assessment & Plan Note (Signed)
Chronic problem.  Tolerating statin and gemfibrozil w/o difficulty.  Stressed need for healthy diet and regular exercise.  Check labs.  Adjust meds prn

## 2017-01-16 NOTE — Assessment & Plan Note (Signed)
Ongoing issue for pt.  She has recently gained 5 lbs due to stress eating.  Discussed the need for healthy diet and regular exercise.  Will follow.

## 2017-01-16 NOTE — Progress Notes (Signed)
Pre visit review using our clinic review tool, if applicable. No additional management support is needed unless otherwise documented below in the visit note. 

## 2017-01-16 NOTE — Patient Instructions (Addendum)
Schedule your complete physical in 6 months We'll notify you of your lab results and make any changes if needed Continue to work on healthy diet and regular exercise- you can do it! Call with any questions or concerns You are doing a great job!  Give yourself the credit you deserve!

## 2017-01-16 NOTE — Progress Notes (Signed)
   Subjective:    Patient ID: Melissa Miles, female    DOB: 1956/06/16, 61 y.o.   MRN: [id number]  HPI Hyperlipidemia- chronic problem.  On Lipitor 40mg  and Gemfibrozil 600mg  daily.  Pt has gained 5 lbs since last visit but she has been under quite a bit of stress.  Not getting any regular exercise.  Denies CP, SOB, HAs, visual changes, abd pain, N/V, myalgias.  Overweight- pt has been stress eating frequently.  Not exercising b/c she's visiting her mother in the nursing home daily.  Pt is upset that she's gaining weight.  Review of Systems For ROS see HPI     Objective:   Physical Exam  Constitutional: She is oriented to person, place, and time. She appears well-developed and well-nourished. No distress.  HENT:  Head: Normocephalic and atraumatic.  Eyes: Conjunctivae and EOM are normal. Pupils are equal, round, and reactive to light.  Neck: Normal range of motion. Neck supple. No thyromegaly present.  Cardiovascular: Normal rate, regular rhythm, normal heart sounds and intact distal pulses.   No murmur heard. Pulmonary/Chest: Effort normal and breath sounds normal. No respiratory distress.  Abdominal: Soft. She exhibits no distension. There is no tenderness.  Musculoskeletal: She exhibits no edema.  Lymphadenopathy:    She has no cervical adenopathy.  Neurological: She is alert and oriented to person, place, and time.  Skin: Skin is warm and dry.  Psychiatric: She has a normal mood and affect. Her behavior is normal.  Vitals reviewed.         Assessment & Plan:

## 2017-01-17 ENCOUNTER — Other Ambulatory Visit: Payer: Self-pay | Admitting: Family Medicine

## 2017-01-18 ENCOUNTER — Other Ambulatory Visit (INDEPENDENT_AMBULATORY_CARE_PROVIDER_SITE_OTHER): Payer: 59

## 2017-01-21 LAB — PTH, INTACT AND CALCIUM
Calcium: 11.9 mg/dL — ABNORMAL HIGH (ref 8.6–10.4)
PTH: 65 pg/mL — ABNORMAL HIGH (ref 14–64)

## 2017-01-22 ENCOUNTER — Other Ambulatory Visit: Payer: Self-pay | Admitting: Family Medicine

## 2017-01-22 DIAGNOSIS — E213 Hyperparathyroidism, unspecified: Secondary | ICD-10-CM

## 2017-01-22 NOTE — Progress Notes (Signed)
Called pt and lmovm to return call.

## 2017-01-30 ENCOUNTER — Other Ambulatory Visit: Payer: Self-pay | Admitting: Family Medicine

## 2017-02-14 ENCOUNTER — Ambulatory Visit (INDEPENDENT_AMBULATORY_CARE_PROVIDER_SITE_OTHER): Payer: 59 | Admitting: Endocrinology

## 2017-02-14 ENCOUNTER — Encounter: Payer: Self-pay | Admitting: Endocrinology

## 2017-02-14 LAB — VITAMIN D 25 HYDROXY (VIT D DEFICIENCY, FRACTURES): VITD: 51.26 ng/mL (ref 30.00–100.00)

## 2017-02-14 LAB — MAGNESIUM: Magnesium: 2.1 mg/dL (ref 1.5–2.5)

## 2017-02-14 LAB — PHOSPHORUS: Phosphorus: 2.7 mg/dL (ref 2.3–4.6)

## 2017-02-14 NOTE — Progress Notes (Signed)
Subjective:    Patient ID: Melissa Miles, female    DOB: 06-28-1956, 61 y.o.   MRN: [id number]  HPI Pt is referred by Dr Birdie Riddle for hypercalcemia.  Pt was noted to have moderate hypercalcemia in 2016 (it was normal in 2015). she has never had osteoporosis, urolithiasis, thyroid probs, parathyroid probs, sarcoidosis, cancer, PUD, pancreatitis, depression, or bony fracture.  she does not take A supplements.  Pt has no h/o any of these: [place], or prolonged immobilization.  Pt denies taking antacids, Li++, or HCTZ.  She takes vit-D, 1000 units/d.  She has slight myalgias of the thighs, and assoc fatigue.    Past Medical History:  Diagnosis Date  . Hyperlipidemia   . IBS (irritable bowel syndrome)    Dr Fuller Plan    Past Surgical History:  Procedure Laterality Date  . CERVICAL BIOPSY     X2 ; PMH abnormal PAP  . CHOLECYSTECTOMY, LAPAROSCOPIC  2002  . COLONOSCOPY      negative X 3    Social History   Social History  . Marital status: Married    Spouse name: N/A  . Number of children: N/A  . Years of education: N/A   Occupational History  . Not on file.   Social History Main Topics  . Smoking status: Never Smoker  . Smokeless tobacco: Never Used  . Alcohol use No  . Drug use: No  . Sexual activity: Not on file   Other Topics Concern  . Not on file   Social History Narrative  . No narrative on file    Current Outpatient Prescriptions on File Prior to Visit  Medication Sig Dispense Refill  . aspirin 81 MG tablet Take 81 mg by mouth daily.    Marland Kitchen atorvastatin (LIPITOR) 40 MG tablet TAKE ONE TABLET BY MOUTH DAILY 30 tablet 5  . Calcium Carbonate-Vit D-Min (CALTRATE PLUS PO) Take 1,200 mg by mouth daily.     . Cholecalciferol (VITAMIN D3) 1000 UNITS CAPS Take by mouth daily.    . DULoxetine (CYMBALTA) 60 MG capsule TAKE ONE CAPSULE BY MOUTH ONCE DAILY 90 capsule 1  . gemfibrozil (LOPID) 600 MG tablet TAKE ONE TABLET BY MOUTH TWICE DAILY BEFORE  A  MEAL 60 tablet 3  .  Multiple Vitamin (MULTIVITAMIN) tablet Take 1 tablet by mouth daily.     No current facility-administered medications on file prior to visit.     No Known Allergies  Family History  Problem Relation Age of Onset  . Mental illness Unknown        half sister had  ? bipolar  . Heart attack Unknown 54       half bro   . Cancer Father        NHL  . Osteoporosis Mother   . Thyroid disease Unknown        half bro , hypothyroidism  . Hypercalcemia Neg Hx    BP 136/84   Pulse (!) 105   Ht 5\' 8"  (1.727 m)   Wt 186 lb (84.4 kg)   SpO2 95%   BMI 28.28 kg/m   Review of Systems Denies weight loss, diplopia, cough, edema, diarrhea, sore throat, polyuria, hematuria, rash, numbness, and back pain.      Objective:   Physical Exam VS: see vs page GEN: no distress HEAD: head: no deformity eyes: no periorbital swelling, no proptosis external nose and ears are normal mouth: no lesion seen NECK: supple, thyroid is not enlarged CHEST WALL: no deformity  LUNGS: clear to auscultation CV: reg rate and rhythm, no murmur ABD: abdomen is soft, nontender.  no hepatosplenomegaly.  not distended.  no hernia MUSCULOSKELETAL: muscle bulk and strength are grossly normal.  no obvious joint swelling.  gait is normal and steady EXTEMITIES: no deformity.  no ulcer on the feet.  feet are of normal color and temp.  no edema PULSES: dorsalis pedis intact bilat.  no carotid bruit NEURO:  cn 2-12 grossly intact.   readily moves all 4's.  sensation is intact to touch on the feet SKIN:  Normal texture and temperature.  No rash or suspicious lesion is visible.   NODES:  None palpable at the neck PSYCH: alert, well-oriented.  Does not appear anxious nor depressed.   Lab Results  Component Value Date   PTH 65 (H) 01/18/2017   CALCIUM 11.9 (H) 01/18/2017   Lab Results  Component Value Date   ALT 35 (H) 01/16/2017   AST 34 01/16/2017   ALKPHOS 54 01/16/2017   BILITOT 0.8 01/16/2017   DEXA: AP Spine   L1-L4       01/27/2016    58.8         -0.9    1.087 g/cm2 DualFemur Total Right 01/27/2016    58.8         -0.7    0.916 g/cm2  I have reviewed outside records, and summarized: Pt was noted to have hypercalcemia, and referred here.  She was noted to be spending great time and effort caring for her mother, who is in poor health.       Assessment & Plan:  Hyperparathyroidism, new to me, prob primary Hypercalcemia: prob due to the above.  I agree we should eval further, as it is out of proportion to the PTH elevation.   Patient Instructions  blood tests are requested for you today.  We'll let you know about the results.

## 2017-02-14 NOTE — Patient Instructions (Addendum)
blood tests are requested for you today.  We'll let you know about the results.  

## 2017-02-15 LAB — PTH, INTACT AND CALCIUM
Calcium: 10.8 mg/dL — ABNORMAL HIGH (ref 8.6–10.4)
PTH: 56 pg/mL (ref 14–64)

## 2017-02-16 LAB — VITAMIN D 1,25 DIHYDROXY
Vitamin D 1, 25 (OH)2 Total: 46 pg/mL (ref 18–72)
Vitamin D2 1, 25 (OH)2: <8 pg/mL
Vitamin D3 1, 25 (OH)2: 46 pg/mL

## 2017-02-19 LAB — VITAMIN A: Vitamin A (Retinoic Acid): 55 ug/dL (ref 38–98)

## 2017-02-19 LAB — PROTEIN ELECTROPHORESIS, SERUM
Albumin ELP: 4.3 g/dL (ref 3.8–4.8)
Alpha-1-Globulin: 0.4 g/dL — ABNORMAL HIGH (ref 0.2–0.3)
Alpha-2-Globulin: 0.7 g/dL (ref 0.5–0.9)
Beta 2: 0.4 g/dL (ref 0.2–0.5)
Beta Globulin: 0.5 g/dL (ref 0.4–0.6)
Gamma Globulin: 1 g/dL (ref 0.8–1.7)
Total Protein, Serum Electrophoresis: 7.4 g/dL (ref 6.1–8.1)

## 2017-02-20 LAB — PTH-RELATED PEPTIDE: PTH-Related Protein (PTH-RP): 14 pg/mL (ref 14–27)

## 2017-02-22 ENCOUNTER — Telehealth: Payer: Self-pay | Admitting: Endocrinology

## 2017-02-22 NOTE — Telephone Encounter (Signed)
Patient wouldl ike a call to go over lab results. She never got a call. Verified cell #

## 2017-02-26 NOTE — Telephone Encounter (Signed)
Can you please call for lab results. Thank you!

## 2017-02-26 NOTE — Telephone Encounter (Signed)
Called pt, no answer. Left a detailed voice message with lab results instructed pt if she had any questions or concerns to call office back.

## 2017-04-22 ENCOUNTER — Other Ambulatory Visit: Payer: Self-pay | Admitting: Family Medicine

## 2017-04-22 DIAGNOSIS — E785 Hyperlipidemia, unspecified: Secondary | ICD-10-CM

## 2017-06-24 ENCOUNTER — Other Ambulatory Visit: Payer: Self-pay | Admitting: Family Medicine

## 2017-07-19 ENCOUNTER — Encounter: Payer: Self-pay | Admitting: Family Medicine

## 2017-07-19 ENCOUNTER — Ambulatory Visit (INDEPENDENT_AMBULATORY_CARE_PROVIDER_SITE_OTHER): Payer: 59 | Admitting: Family Medicine

## 2017-07-19 VITALS — BP 134/88 | HR 88 | Temp 98.5°F | Resp 18 | Ht 68.0 in | Wt 189.0 lb

## 2017-07-19 DIAGNOSIS — Z23 Encounter for immunization: Secondary | ICD-10-CM

## 2017-07-19 DIAGNOSIS — Z0000 Encounter for general adult medical examination without abnormal findings: Secondary | ICD-10-CM

## 2017-07-19 DIAGNOSIS — M8580 Other specified disorders of bone density and structure, unspecified site: Secondary | ICD-10-CM | POA: Diagnosis not present

## 2017-07-19 DIAGNOSIS — E785 Hyperlipidemia, unspecified: Secondary | ICD-10-CM

## 2017-07-19 LAB — CBC WITH DIFFERENTIAL/PLATELET
Basophils Absolute: 0 10*3/uL (ref 0.0–0.1)
Basophils Relative: 0.4 % (ref 0.0–3.0)
Eosinophils Absolute: 0.2 10*3/uL (ref 0.0–0.7)
Eosinophils Relative: 3.5 % (ref 0.0–5.0)
HCT: 40.7 % (ref 36.0–46.0)
Hemoglobin: 13.6 g/dL (ref 12.0–15.0)
Lymphocytes Relative: 37.9 % (ref 12.0–46.0)
Lymphs Abs: 2.2 10*3/uL (ref 0.7–4.0)
MCHC: 33.5 g/dL (ref 30.0–36.0)
MCV: 90.9 fl (ref 78.0–100.0)
Monocytes Absolute: 0.4 10*3/uL (ref 0.1–1.0)
Monocytes Relative: 6.2 % (ref 3.0–12.0)
Neutro Abs: 3 10*3/uL (ref 1.4–7.7)
Neutrophils Relative %: 52 % (ref 43.0–77.0)
Platelets: 273 10*3/uL (ref 150.0–400.0)
RBC: 4.47 Mil/uL (ref 3.87–5.11)
RDW: 13 % (ref 11.5–15.5)
WBC: 5.9 10*3/uL (ref 4.0–10.5)

## 2017-07-19 LAB — BASIC METABOLIC PANEL
BUN: 20 mg/dL (ref 6–23)
CO2: 28 mEq/L (ref 19–32)
Calcium: 11 mg/dL — ABNORMAL HIGH (ref 8.4–10.5)
Chloride: 103 mEq/L (ref 96–112)
Creatinine, Ser: 0.74 mg/dL (ref 0.40–1.20)
GFR: 85 mL/min (ref >60.00)
Glucose, Bld: 91 mg/dL (ref 70–99)
Potassium: 3.9 mEq/L (ref 3.5–5.1)
Sodium: 139 mEq/L (ref 135–145)

## 2017-07-19 LAB — LIPID PANEL
Cholesterol: 172 mg/dL (ref 0–200)
HDL: 44.8 mg/dL (ref >39.00)
LDL Cholesterol: 112 mg/dL — ABNORMAL HIGH (ref 0–99)
NonHDL: 127.56
Total CHOL/HDL Ratio: 4
Triglycerides: 80 mg/dL (ref 0.0–149.0)
VLDL: 16 mg/dL (ref 0.0–40.0)

## 2017-07-19 LAB — HEPATIC FUNCTION PANEL
ALT: 54 U/L — ABNORMAL HIGH (ref 0–35)
AST: 47 U/L — ABNORMAL HIGH (ref 0–37)
Albumin: 4.7 g/dL (ref 3.5–5.2)
Alkaline Phosphatase: 56 U/L (ref 39–117)
Bilirubin, Direct: 0.1 mg/dL (ref 0.0–0.3)
Total Bilirubin: 0.7 mg/dL (ref 0.2–1.2)
Total Protein: 7.6 g/dL (ref 6.0–8.3)

## 2017-07-19 LAB — VITAMIN D 25 HYDROXY (VIT D DEFICIENCY, FRACTURES): VITD: 67.53 ng/mL (ref 30.00–100.00)

## 2017-07-19 LAB — TSH: TSH: 1.12 u[IU]/mL (ref 0.35–4.50)

## 2017-07-19 NOTE — Patient Instructions (Signed)
Follow up in 6 months to recheck cholesterol We'll notify you of your lab results and make any changes if needed Continue to work on healthy diet and regular exercise- you can do it!! Call and schedule your mammogram!  This is very important! Call with any questions or concerns Happy Fall!!

## 2017-07-19 NOTE — Progress Notes (Signed)
   Subjective:    Patient ID: Melissa Miles, female    DOB: [birth year], 61 y.o.   MRN: [id number]  HPI CPE- UTD on colonoscopy (due 2020), pap.  Overdue for mammo- [facility].     Review of Systems Patient reports no vision/ hearing changes, adenopathy,fever, weight change,  persistant/recurrent hoarseness , swallowing issues, chest pain, palpitations, edema, persistant/recurrent cough, hemoptysis, dyspnea (rest/exertional/paroxysmal nocturnal), gastrointestinal bleeding (melena, rectal bleeding), abdominal pain, significant heartburn, bowel changes, GU symptoms (dysuria, hematuria, incontinence), Gyn symptoms (abnormal  bleeding, pain),  syncope, focal weakness, memory loss, numbness & tingling, skin/hair/nail changes, abnormal bruising or bleeding, anxiety, or depression.     Objective:   Physical Exam General Appearance:    Alert, cooperative, no distress, appears stated age  Head:    Normocephalic, without obvious abnormality, atraumatic  Eyes:    PERRL, conjunctiva/corneas clear, EOM's intact, fundi    benign, both eyes  Ears:    Normal TM's and external ear canals, both ears  Nose:   Nares normal, septum midline, mucosa normal, no drainage    or sinus tenderness  Throat:   Lips, mucosa, and tongue normal; teeth and gums normal  Neck:   Supple, symmetrical, trachea midline, no adenopathy;    Thyroid: no enlargement/tenderness/nodules  Back:     Symmetric, no curvature, ROM normal, no CVA tenderness  Lungs:     Clear to auscultation bilaterally, respirations unlabored  Chest Wall:    No tenderness or deformity   Heart:    Regular rate and rhythm, S1 and S2 normal, no murmur, rub   or gallop  Breast Exam:    Deferred to GYN  Abdomen:     Soft, non-tender, bowel sounds active all four quadrants,    no masses, no organomegaly  Genitalia:    Deferred to GYN  Rectal:    Extremities:   Extremities normal, atraumatic, no cyanosis or edema  Pulses:   2+ and symmetric all  extremities  Skin:   Skin color, texture, turgor normal, no rashes or lesions  Lymph nodes:   Cervical, supraclavicular, and axillary nodes normal  Neurologic:   CNII-XII intact, normal strength, sensation and reflexes    throughout          Assessment & Plan:

## 2017-07-19 NOTE — Assessment & Plan Note (Signed)
Check Vit D level.  Replete prn. °

## 2017-07-19 NOTE — Assessment & Plan Note (Signed)
Pt's PE WNL w/ exception of being overweight.  UTD on pap, colonoscopy.  Due for mammo- pt plans to schedule.  Check labs.  Anticipatory guidance provided.

## 2017-07-19 NOTE — Assessment & Plan Note (Signed)
Chronic problem.  Tolerating statin w/o difficulty.  Check labs.  Adjust meds prn  

## 2017-07-24 ENCOUNTER — Other Ambulatory Visit: Payer: Self-pay | Admitting: Family Medicine

## 2017-07-24 DIAGNOSIS — R945 Abnormal results of liver function studies: Principal | ICD-10-CM

## 2017-07-24 DIAGNOSIS — R7989 Other specified abnormal findings of blood chemistry: Secondary | ICD-10-CM

## 2017-07-24 NOTE — Progress Notes (Signed)
Called pt and lmovm to return call.

## 2017-07-26 ENCOUNTER — Other Ambulatory Visit: Payer: Self-pay | Admitting: Family Medicine

## 2017-08-05 ENCOUNTER — Other Ambulatory Visit (INDEPENDENT_AMBULATORY_CARE_PROVIDER_SITE_OTHER): Payer: 59

## 2017-08-05 DIAGNOSIS — R945 Abnormal results of liver function studies: Secondary | ICD-10-CM

## 2017-08-05 DIAGNOSIS — R7989 Other specified abnormal findings of blood chemistry: Secondary | ICD-10-CM

## 2017-08-06 LAB — HEPATIC FUNCTION PANEL
ALT: 50 U/L — ABNORMAL HIGH (ref 0–35)
AST: 42 U/L — ABNORMAL HIGH (ref 0–37)
Albumin: 4.7 g/dL (ref 3.5–5.2)
Alkaline Phosphatase: 49 U/L (ref 39–117)
Bilirubin, Direct: 0.1 mg/dL (ref 0.0–0.3)
Total Bilirubin: 0.6 mg/dL (ref 0.2–1.2)
Total Protein: 7.5 g/dL (ref 6.0–8.3)

## 2017-08-06 LAB — PTH, INTACT AND CALCIUM
Calcium: 10.8 mg/dL — ABNORMAL HIGH (ref 8.6–10.4)
PTH: 52 pg/mL (ref 14–64)

## 2017-08-08 ENCOUNTER — Encounter: Payer: Self-pay | Admitting: General Practice

## 2017-08-08 ENCOUNTER — Telehealth: Payer: Self-pay | Admitting: Family Medicine

## 2017-08-08 NOTE — Telephone Encounter (Signed)
Please call pt with results. She is concerned since previous results were abnml.  Thank you!

## 2017-08-08 NOTE — Telephone Encounter (Signed)
Copied from [place] [id number]. Topic: Quick Communication - See Telephone Encounter >> Aug 08, 2017  1:29 PM Vernona Rieger wrote: CRM for notification. See Telephone encounter for:   Patient would like a call on her lab results.... 08/08/17.

## 2017-08-09 ENCOUNTER — Telehealth: Payer: Self-pay | Admitting: Family Medicine

## 2017-08-09 NOTE — Telephone Encounter (Signed)
Lab results given to patient with recommendations from the provider given also. Pt voiced understanding.

## 2017-08-09 NOTE — Telephone Encounter (Signed)
Called pt and left a detailed message per DPR that labs were normal.   Ok for [facility] to Discuss results / PCP recommendations / Schedule patient.

## 2017-09-03 ENCOUNTER — Other Ambulatory Visit: Payer: Self-pay | Admitting: Family Medicine

## 2017-09-03 DIAGNOSIS — E785 Hyperlipidemia, unspecified: Secondary | ICD-10-CM

## 2018-01-02 ENCOUNTER — Other Ambulatory Visit: Payer: Self-pay | Admitting: Family Medicine

## 2018-01-02 DIAGNOSIS — E785 Hyperlipidemia, unspecified: Secondary | ICD-10-CM

## 2018-01-22 ENCOUNTER — Encounter: Payer: Self-pay | Admitting: Family Medicine

## 2018-01-22 ENCOUNTER — Ambulatory Visit: Payer: 59 | Admitting: Family Medicine

## 2018-01-22 ENCOUNTER — Other Ambulatory Visit: Payer: Self-pay

## 2018-01-22 VITALS — BP 126/88 | HR 94 | Temp 98.7°F | Resp 16 | Ht 68.0 in | Wt 188.0 lb

## 2018-01-22 DIAGNOSIS — E785 Hyperlipidemia, unspecified: Secondary | ICD-10-CM | POA: Diagnosis not present

## 2018-01-22 NOTE — Progress Notes (Signed)
   Subjective:    Patient ID: Melissa Miles, female    DOB: [birth year], 62 y.o.   MRN: [id number]  HPI Hyperlipidemia- chronic problem, on Lipitor 40mg  daily and Gemfibrozil 600mg  BID.  Some exercise.  Pt reports eating poorly recently.  No CP, SOB, HAs, visual changes, abd pain, N/V.   Review of Systems For ROS see HPI     Objective:   Physical Exam  Constitutional: She is oriented to person, place, and time. She appears well-developed and well-nourished. No distress.  HENT:  Head: Normocephalic and atraumatic.  Eyes: Pupils are equal, round, and reactive to light. Conjunctivae and EOM are normal.  Neck: Normal range of motion. Neck supple. No thyromegaly present.  Cardiovascular: Normal rate, regular rhythm, normal heart sounds and intact distal pulses.  No murmur heard. Pulmonary/Chest: Effort normal and breath sounds normal. No respiratory distress.  Abdominal: Soft. She exhibits no distension. There is no tenderness.  Musculoskeletal: She exhibits no edema.  Lymphadenopathy:    She has no cervical adenopathy.  Neurological: She is alert and oriented to person, place, and time.  Skin: Skin is warm and dry.  Psychiatric: She has a normal mood and affect. Her behavior is normal.  Vitals reviewed.         Assessment & Plan:

## 2018-01-22 NOTE — Assessment & Plan Note (Signed)
Chronic problem.  Tolerating statin w/o difficulty.  Stressed need for healthy diet and regular exercise.  Check labs.  Adjust meds prn  

## 2018-01-22 NOTE — Patient Instructions (Signed)
Schedule your complete physical in 6 months We'll notify you of your lab results and make any changes if needed Continue to work on healthy diet and regular exercise- you can do it! Call with any questions or concerns Happy [place]!! 

## 2018-01-23 ENCOUNTER — Other Ambulatory Visit: Payer: Self-pay | Admitting: Emergency Medicine

## 2018-01-23 LAB — BASIC METABOLIC PANEL
BUN/Creatinine Ratio: 32 (calc) — ABNORMAL HIGH (ref 6–22)
BUN: 28 mg/dL — ABNORMAL HIGH (ref 7–25)
CO2: 27 mmol/L (ref 20–32)
Calcium: 11 mg/dL — ABNORMAL HIGH (ref 8.6–10.4)
Chloride: 104 mmol/L (ref 98–110)
Creat: 0.88 mg/dL (ref 0.50–0.99)
Glucose, Bld: 89 mg/dL (ref 65–99)
Potassium: 4 mmol/L (ref 3.5–5.3)
Sodium: 141 mmol/L (ref 135–146)

## 2018-01-23 LAB — HEPATIC FUNCTION PANEL
AG Ratio: 1.8 (calc) (ref 1.0–2.5)
ALT: 48 U/L — ABNORMAL HIGH (ref 6–29)
AST: 51 U/L — ABNORMAL HIGH (ref 10–35)
Albumin: 4.8 g/dL (ref 3.6–5.1)
Alkaline phosphatase (APISO): 57 U/L (ref 33–130)
Bilirubin, Direct: 0.2 mg/dL (ref 0.0–0.2)
Globulin: 2.7 g/dL (calc) (ref 1.9–3.7)
Indirect Bilirubin: 0.6 mg/dL (calc) (ref 0.2–1.2)
Total Bilirubin: 0.8 mg/dL (ref 0.2–1.2)
Total Protein: 7.5 g/dL (ref 6.1–8.1)

## 2018-01-23 LAB — LIPID PANEL
Cholesterol: 167 mg/dL (ref <200)
HDL: 49 mg/dL — ABNORMAL LOW (ref >50)
LDL Cholesterol (Calc): 102 mg/dL (calc) — ABNORMAL HIGH
Non-HDL Cholesterol (Calc): 118 mg/dL (calc) (ref <130)
Total CHOL/HDL Ratio: 3.4 (calc) (ref <5.0)
Triglycerides: 72 mg/dL (ref <150)

## 2018-01-23 NOTE — Progress Notes (Signed)
PO

## 2018-01-27 ENCOUNTER — Other Ambulatory Visit (INDEPENDENT_AMBULATORY_CARE_PROVIDER_SITE_OTHER): Payer: 59

## 2018-01-28 ENCOUNTER — Other Ambulatory Visit: Payer: Self-pay | Admitting: Family Medicine

## 2018-01-28 DIAGNOSIS — E213 Hyperparathyroidism, unspecified: Secondary | ICD-10-CM

## 2018-01-28 LAB — PTH, INTACT AND CALCIUM
Calcium: 12.1 mg/dL — ABNORMAL HIGH (ref 8.6–10.4)
PTH: 66 pg/mL — ABNORMAL HIGH (ref 14–64)

## 2018-02-01 ENCOUNTER — Other Ambulatory Visit: Payer: Self-pay | Admitting: Family Medicine

## 2018-02-27 DIAGNOSIS — E210 Primary hyperparathyroidism: Secondary | ICD-10-CM | POA: Diagnosis not present

## 2018-03-13 DIAGNOSIS — E210 Primary hyperparathyroidism: Secondary | ICD-10-CM | POA: Diagnosis not present

## 2018-05-19 ENCOUNTER — Other Ambulatory Visit: Payer: Self-pay | Admitting: Family Medicine

## 2018-05-19 DIAGNOSIS — E785 Hyperlipidemia, unspecified: Secondary | ICD-10-CM

## 2018-07-07 ENCOUNTER — Other Ambulatory Visit: Payer: Self-pay | Admitting: Family Medicine

## 2018-07-07 NOTE — Telephone Encounter (Signed)
Copied from [place] [id number]. Topic: Quick Communication - Rx Refill/Question >> Jul 07, 2018  5:47 PM Selinda Flavin B, NT wrote: **Patient states that she only has 2 pills left**  Medication: DULoxetine (CYMBALTA) 60 MG capsule  Has the patient contacted their pharmacy? Yes.  Pharmacy has been waiting on a faxed response and have not heard anything back. (Agent: If no, request that the patient contact the pharmacy for the refill.) (Agent: If yes, when and what did the pharmacy advise?)   Preferred Pharmacy (with phone number or street name): [facility], [place] - [address]  Agent: Please be advised that RX refills may take up to 3 business days. We ask that you follow-up with your pharmacy.

## 2018-07-08 MED ORDER — DULOXETINE HCL 60 MG PO CPEP: 60.0000 mg | ORAL_CAPSULE | Freq: Every day | ORAL | 1 refills | Status: DC

## 2018-07-23 ENCOUNTER — Ambulatory Visit (INDEPENDENT_AMBULATORY_CARE_PROVIDER_SITE_OTHER): Payer: 59 | Admitting: Family Medicine

## 2018-07-23 ENCOUNTER — Other Ambulatory Visit: Payer: Self-pay

## 2018-07-23 ENCOUNTER — Encounter: Payer: Self-pay | Admitting: Family Medicine

## 2018-07-23 VITALS — BP 124/84 | HR 100 | Temp 98.2°F | Resp 16 | Ht 68.0 in | Wt 194.2 lb

## 2018-07-23 DIAGNOSIS — E785 Hyperlipidemia, unspecified: Secondary | ICD-10-CM | POA: Diagnosis not present

## 2018-07-23 DIAGNOSIS — Z0000 Encounter for general adult medical examination without abnormal findings: Secondary | ICD-10-CM | POA: Diagnosis not present

## 2018-07-23 NOTE — Assessment & Plan Note (Signed)
Chronic problem.  Tolerating statin w/o difficulty.  Check labs.  Adjust meds prn  

## 2018-07-23 NOTE — Patient Instructions (Signed)
Follow up in 6 months to recheck cholesterol We'll notify you of your lab results and make any changes if needed Continue to work on healthy diet and regular exercise- you can do it!! Call with any questions or concerns Happy Fall!!!

## 2018-07-23 NOTE — Progress Notes (Signed)
   Subjective:    Patient ID: Melissa Miles, female    DOB: 06-Dec-1955, 62 y.o.   MRN: [id number]  HPI CPE- UTD on Tdap, colonoscopy.  GYN appt scheduled.  Pt to get flu today.   Review of Systems Patient reports no vision/ hearing changes, adenopathy,fever, weight change,  persistant/recurrent hoarseness , swallowing issues, chest pain, palpitations, edema, persistant/recurrent cough, hemoptysis, dyspnea (rest/exertional/paroxysmal nocturnal), gastrointestinal bleeding (melena, rectal bleeding), abdominal pain, significant heartburn, bowel changes, GU symptoms (dysuria, hematuria, incontinence), Gyn symptoms (abnormal  bleeding, pain),  syncope, focal weakness, memory loss, numbness & tingling, skin/hair/nail changes, abnormal bruising or bleeding, anxiety, or depression.     Objective:   Physical Exam General Appearance:    Alert, cooperative, no distress, appears stated age  Head:    Normocephalic, without obvious abnormality, atraumatic  Eyes:    PERRL, conjunctiva/corneas clear, EOM's intact, fundi    benign, both eyes  Ears:    Normal TM's and external ear canals, both ears  Nose:   Nares normal, septum midline, mucosa normal, no drainage    or sinus tenderness  Throat:   Lips, mucosa, and tongue normal; teeth and gums normal  Neck:   Supple, symmetrical, trachea midline, no adenopathy;    Thyroid: no enlargement/tenderness/nodules  Back:     Symmetric, no curvature, ROM normal, no CVA tenderness  Lungs:     Clear to auscultation bilaterally, respirations unlabored  Chest Wall:    No tenderness or deformity   Heart:    Regular rate and rhythm, S1 and S2 normal, no murmur, rub   or gallop  Breast Exam:    Deferred to GYN  Abdomen:     Soft, non-tender, bowel sounds active all four quadrants,    no masses, no organomegaly  Genitalia:    Deferred to GYN  Rectal:    Extremities:   Extremities normal, atraumatic, no cyanosis or edema  Pulses:   2+ and symmetric all extremities    Skin:   Skin color, texture, turgor normal, no rashes or lesions  Lymph nodes:   Cervical, supraclavicular, and axillary nodes normal  Neurologic:   CNII-XII intact, normal strength, sensation and reflexes    throughout          Assessment & Plan:

## 2018-07-23 NOTE — Assessment & Plan Note (Signed)
Pt's PE WNL w/ exception of being overweight.  UTD on Tdap, colonoscopy.  GYN scheduled.  Flu shot given today.  Check labs.  Anticipatory guidance provided.

## 2018-07-24 ENCOUNTER — Encounter: Payer: Self-pay | Admitting: General Practice

## 2018-07-24 LAB — BASIC METABOLIC PANEL
BUN: 24 mg/dL — ABNORMAL HIGH (ref 6–23)
CO2: 26 mEq/L (ref 19–32)
Calcium: 10.8 mg/dL — ABNORMAL HIGH (ref 8.4–10.5)
Chloride: 102 mEq/L (ref 96–112)
Creatinine, Ser: 0.78 mg/dL (ref 0.40–1.20)
GFR: 79.72 mL/min (ref >60.00)
Glucose, Bld: 88 mg/dL (ref 70–99)
Potassium: 3.9 mEq/L (ref 3.5–5.1)
Sodium: 137 mEq/L (ref 135–145)

## 2018-07-24 LAB — CBC WITH DIFFERENTIAL/PLATELET
Basophils Absolute: 0 10*3/uL (ref 0.0–0.1)
Basophils Relative: 0.6 % (ref 0.0–3.0)
Eosinophils Absolute: 0.4 10*3/uL (ref 0.0–0.7)
Eosinophils Relative: 6 % — ABNORMAL HIGH (ref 0.0–5.0)
HCT: 40.6 % (ref 36.0–46.0)
Hemoglobin: 13.8 g/dL (ref 12.0–15.0)
Lymphocytes Relative: 35 % (ref 12.0–46.0)
Lymphs Abs: 2.3 10*3/uL (ref 0.7–4.0)
MCHC: 34 g/dL (ref 30.0–36.0)
MCV: 89.9 fl (ref 78.0–100.0)
Monocytes Absolute: 0.4 10*3/uL (ref 0.1–1.0)
Monocytes Relative: 6.7 % (ref 3.0–12.0)
Neutro Abs: 3.4 10*3/uL (ref 1.4–7.7)
Neutrophils Relative %: 51.7 % (ref 43.0–77.0)
Platelets: 264 10*3/uL (ref 150.0–400.0)
RBC: 4.52 Mil/uL (ref 3.87–5.11)
RDW: 13 % (ref 11.5–15.5)
WBC: 6.6 10*3/uL (ref 4.0–10.5)

## 2018-07-24 LAB — HEPATIC FUNCTION PANEL
ALT: 41 U/L — ABNORMAL HIGH (ref 0–35)
AST: 40 U/L — ABNORMAL HIGH (ref 0–37)
Albumin: 4.7 g/dL (ref 3.5–5.2)
Alkaline Phosphatase: 55 U/L (ref 39–117)
Bilirubin, Direct: 0.1 mg/dL (ref 0.0–0.3)
Total Bilirubin: 0.7 mg/dL (ref 0.2–1.2)
Total Protein: 7.8 g/dL (ref 6.0–8.3)

## 2018-07-24 LAB — LIPID PANEL
Cholesterol: 164 mg/dL (ref 0–200)
HDL: 45 mg/dL (ref >39.00)
LDL Cholesterol: 103 mg/dL — ABNORMAL HIGH (ref 0–99)
NonHDL: 119.26
Total CHOL/HDL Ratio: 4
Triglycerides: 79 mg/dL (ref 0.0–149.0)
VLDL: 15.8 mg/dL (ref 0.0–40.0)

## 2018-07-24 LAB — TSH: TSH: 1.1 u[IU]/mL (ref 0.35–4.50)

## 2018-08-04 DIAGNOSIS — Z13820 Encounter for screening for osteoporosis: Secondary | ICD-10-CM | POA: Diagnosis not present

## 2018-08-04 DIAGNOSIS — Z6829 Body mass index (BMI) 29.0-29.9, adult: Secondary | ICD-10-CM | POA: Diagnosis not present

## 2018-08-04 DIAGNOSIS — Z01419 Encounter for gynecological examination (general) (routine) without abnormal findings: Secondary | ICD-10-CM | POA: Diagnosis not present

## 2018-08-04 DIAGNOSIS — Z1231 Encounter for screening mammogram for malignant neoplasm of breast: Secondary | ICD-10-CM | POA: Diagnosis not present

## 2018-08-04 LAB — HM MAMMOGRAPHY: HM Mammogram: NORMAL (ref 0–4)

## 2018-08-04 LAB — HM PAP SMEAR

## 2018-08-04 LAB — HM DEXA SCAN

## 2018-08-05 ENCOUNTER — Other Ambulatory Visit: Payer: Self-pay | Admitting: Family Medicine

## 2018-08-14 ENCOUNTER — Encounter: Payer: Self-pay | Admitting: General Practice

## 2018-09-02 ENCOUNTER — Encounter: Payer: Self-pay | Admitting: Family Medicine

## 2018-09-06 ENCOUNTER — Other Ambulatory Visit: Payer: Self-pay | Admitting: Family Medicine

## 2018-09-06 DIAGNOSIS — E785 Hyperlipidemia, unspecified: Secondary | ICD-10-CM

## 2018-09-08 NOTE — Telephone Encounter (Signed)
Please advise. This medication is not listed on her active med list anymore.

## 2018-10-18 ENCOUNTER — Other Ambulatory Visit: Payer: Self-pay | Admitting: Family Medicine

## 2018-10-18 DIAGNOSIS — E785 Hyperlipidemia, unspecified: Secondary | ICD-10-CM

## 2018-11-23 ENCOUNTER — Other Ambulatory Visit: Payer: Self-pay | Admitting: Family Medicine

## 2018-11-23 DIAGNOSIS — E785 Hyperlipidemia, unspecified: Secondary | ICD-10-CM

## 2018-11-24 NOTE — Telephone Encounter (Signed)
Ok to give refills?

## 2019-01-06 ENCOUNTER — Other Ambulatory Visit: Payer: Self-pay | Admitting: *Deleted

## 2019-01-06 MED ORDER — DULOXETINE HCL 60 MG PO CPEP: 60.0000 mg | ORAL_CAPSULE | Freq: Every day | ORAL | 1 refills | Status: DC

## 2019-01-21 ENCOUNTER — Ambulatory Visit: Payer: 59 | Admitting: Family Medicine

## 2019-01-21 ENCOUNTER — Ambulatory Visit (INDEPENDENT_AMBULATORY_CARE_PROVIDER_SITE_OTHER): Payer: 59 | Admitting: Family Medicine

## 2019-01-21 ENCOUNTER — Encounter: Payer: Self-pay | Admitting: Family Medicine

## 2019-01-21 ENCOUNTER — Other Ambulatory Visit: Payer: Self-pay

## 2019-01-21 VITALS — Ht 68.0 in

## 2019-01-21 DIAGNOSIS — E785 Hyperlipidemia, unspecified: Secondary | ICD-10-CM

## 2019-01-21 DIAGNOSIS — E663 Overweight: Secondary | ICD-10-CM

## 2019-01-21 NOTE — Progress Notes (Signed)
I have discussed the procedure for the virtual visit with the patient who has given consent to proceed with assessment and treatment.   Neleh Muldoon, CMA     

## 2019-01-21 NOTE — Progress Notes (Signed)
   Virtual Visit via Video   I connected with patient on 01/21/19 at 10:00 AM EDT by a video enabled telemedicine application and verified that I am speaking with the correct person using two identifiers.  Location patient: Home Location provider: [facility], Office Persons participating in the virtual visit: Patient, Provider, [place] ([place])  I discussed the limitations of evaluation and management by telemedicine and the availability of in person appointments. The patient expressed understanding and agreed to proceed.  Subjective:   HPI:   Hyperlipidemia- chronic problem, on Lipitor 40mg  daily and Gemfibrozil 600mg  daily.  Pt reports being 'very very lazy' until her husband pushed the issue.  Now walking 1-2 miles per day (starting last week) and feeling much better.  No CP, SOB, HAs, abd pain, N/V.  Hypercalcemia- was told by Endo to stop calcium supplementation but GYN wanted her to restart 1200 calcium daily.  Pt wants to know what to do.  Overweight- ongoing issue.  Just started exercising last week and she reports feeling much better.  ROS:   See pertinent positives and negatives per HPI.  Patient Active Problem List   Diagnosis Date Noted  . Hypercalcemia 02/14/2017  . Physical exam 07/09/2014  . Osteopenia 01/06/2014  . Overweight 01/06/2014  . Hyperlipidemia 08/08/2010  . ANXIETY DISORDER 01/24/2010  . POSTMENOPAUSAL SYNDROME 01/24/2010  . PAP SMEAR, ABNORMAL 01/24/2010  . IRRITABLE BOWEL SYNDROME, HX OF 01/24/2010    Social History   Tobacco Use  . Smoking status: Never Smoker  . Smokeless tobacco: Never Used  Substance Use Topics  . Alcohol use: No    Current Outpatient Medications:  .  aspirin 81 MG tablet, Take 81 mg by mouth daily., Disp: , Rfl:  .  atorvastatin (LIPITOR) 40 MG tablet, TAKE 1 TABLET BY MOUTH ONCE DAILY, Disp: 30 tablet, Rfl: 5 .  DULoxetine (CYMBALTA) 60 MG capsule, Take 1 capsule (60 mg total) by mouth daily., Disp: 90  capsule, Rfl: 1 .  gemfibrozil (LOPID) 600 MG tablet, TAKE 1 TABLET BY MOUTH TWICE DAILY BEFORE MEAL(S), Disp: 60 tablet, Rfl: 3 .  Multiple Vitamin (MULTIVITAMIN) tablet, Take 1 tablet by mouth daily., Disp: , Rfl:   No Known Allergies  Objective:   Ht 5\' 8"  (1.727 m)   BMI 29.53 kg/m   AAOx3, NAD NCAT, EOMI No obvious CN deficits Coloring WNL Pt is able to speak clearly, coherently without shortness of breath or increased work of breathing.  Thought process is linear.  Mood is appropriate.   Assessment and Plan:   Hyperlipidemia- chronic problem, tolerating meds w/o difficulty.  Applauded her recent efforts at exercise.  Check labs.  Adjust meds prn   Hypercalcemia- rather than starting the 1200 unit supplement that GYN recommended will start 600 and monitor labs.  Pt expressed understanding and is in agreement w/ plan.   Overweight- encouraged pt to continue her regular exercise and focus on healthy diet during this time of quarantine.  Will continue to follow.  Annye Asa, MD 01/21/2019

## 2019-01-30 ENCOUNTER — Other Ambulatory Visit: Payer: 59

## 2019-02-06 ENCOUNTER — Other Ambulatory Visit (INDEPENDENT_AMBULATORY_CARE_PROVIDER_SITE_OTHER): Payer: 59

## 2019-02-06 DIAGNOSIS — E785 Hyperlipidemia, unspecified: Secondary | ICD-10-CM

## 2019-02-06 LAB — HEPATIC FUNCTION PANEL
ALT: 58 U/L — ABNORMAL HIGH (ref 0–35)
AST: 52 U/L — ABNORMAL HIGH (ref 0–37)
Albumin: 4.3 g/dL (ref 3.5–5.2)
Alkaline Phosphatase: 68 U/L (ref 39–117)
Bilirubin, Direct: 0.1 mg/dL (ref 0.0–0.3)
Total Bilirubin: 0.5 mg/dL (ref 0.2–1.2)
Total Protein: 7.2 g/dL (ref 6.0–8.3)

## 2019-02-06 LAB — BASIC METABOLIC PANEL
BUN: 22 mg/dL (ref 6–23)
CO2: 23 mEq/L (ref 19–32)
Calcium: 10.5 mg/dL (ref 8.4–10.5)
Chloride: 105 mEq/L (ref 96–112)
Creatinine, Ser: 0.81 mg/dL (ref 0.40–1.20)
GFR: 71.68 mL/min (ref >60.00)
Glucose, Bld: 109 mg/dL — ABNORMAL HIGH (ref 70–99)
Potassium: 4.4 mEq/L (ref 3.5–5.1)
Sodium: 139 mEq/L (ref 135–145)

## 2019-02-06 LAB — LIPID PANEL
Cholesterol: 159 mg/dL (ref 0–200)
HDL: 38.4 mg/dL — ABNORMAL LOW (ref >39.00)
LDL Cholesterol: 101 mg/dL — ABNORMAL HIGH (ref 0–99)
NonHDL: 120.21
Total CHOL/HDL Ratio: 4
Triglycerides: 98 mg/dL (ref 0.0–149.0)
VLDL: 19.6 mg/dL (ref 0.0–40.0)

## 2019-02-06 LAB — TSH: TSH: 1.84 u[IU]/mL (ref 0.35–4.50)

## 2019-02-09 ENCOUNTER — Other Ambulatory Visit: Payer: Self-pay | Admitting: Family Medicine

## 2019-02-09 DIAGNOSIS — R7989 Other specified abnormal findings of blood chemistry: Secondary | ICD-10-CM

## 2019-02-23 ENCOUNTER — Ambulatory Visit (INDEPENDENT_AMBULATORY_CARE_PROVIDER_SITE_OTHER): Payer: 59 | Admitting: Family Medicine

## 2019-02-23 ENCOUNTER — Other Ambulatory Visit: Payer: Self-pay

## 2019-02-23 DIAGNOSIS — R7989 Other specified abnormal findings of blood chemistry: Secondary | ICD-10-CM

## 2019-02-23 DIAGNOSIS — R945 Abnormal results of liver function studies: Secondary | ICD-10-CM | POA: Diagnosis not present

## 2019-02-23 LAB — HEPATIC FUNCTION PANEL
ALT: 52 U/L — ABNORMAL HIGH (ref 0–35)
AST: 43 U/L — ABNORMAL HIGH (ref 0–37)
Albumin: 4.5 g/dL (ref 3.5–5.2)
Alkaline Phosphatase: 65 U/L (ref 39–117)
Bilirubin, Direct: 0.1 mg/dL (ref 0.0–0.3)
Total Bilirubin: 0.6 mg/dL (ref 0.2–1.2)
Total Protein: 7.2 g/dL (ref 6.0–8.3)

## 2019-02-24 ENCOUNTER — Telehealth: Payer: Self-pay | Admitting: Family Medicine

## 2019-02-24 NOTE — Telephone Encounter (Signed)
called and LMOVM to inform pt of lab results a second time.   [place] for [facility] to Discuss results / PCP recommendations / Schedule patient.

## 2019-02-24 NOTE — Telephone Encounter (Signed)
Pt lmovm asking for a call back to discuss labs, pt states that she missed a call earlier.

## 2019-02-26 ENCOUNTER — Other Ambulatory Visit: Payer: Self-pay | Admitting: Family Medicine

## 2019-03-27 ENCOUNTER — Other Ambulatory Visit: Payer: Self-pay | Admitting: Family Medicine

## 2019-03-27 DIAGNOSIS — E785 Hyperlipidemia, unspecified: Secondary | ICD-10-CM

## 2019-04-27 ENCOUNTER — Other Ambulatory Visit: Payer: Self-pay | Admitting: Family Medicine

## 2019-04-27 DIAGNOSIS — E785 Hyperlipidemia, unspecified: Secondary | ICD-10-CM

## 2019-06-17 ENCOUNTER — Other Ambulatory Visit: Payer: Self-pay | Admitting: Family Medicine

## 2019-07-29 ENCOUNTER — Ambulatory Visit (INDEPENDENT_AMBULATORY_CARE_PROVIDER_SITE_OTHER): Payer: BC Managed Care – PPO | Admitting: Family Medicine

## 2019-07-29 ENCOUNTER — Other Ambulatory Visit: Payer: Self-pay

## 2019-07-29 ENCOUNTER — Encounter: Payer: Self-pay | Admitting: Family Medicine

## 2019-07-29 VITALS — BP 121/78 | HR 76 | Temp 97.9°F | Resp 16 | Ht 68.0 in | Wt 195.0 lb

## 2019-07-29 DIAGNOSIS — Z23 Encounter for immunization: Secondary | ICD-10-CM | POA: Diagnosis not present

## 2019-07-29 DIAGNOSIS — E663 Overweight: Secondary | ICD-10-CM

## 2019-07-29 DIAGNOSIS — M8580 Other specified disorders of bone density and structure, unspecified site: Secondary | ICD-10-CM

## 2019-07-29 DIAGNOSIS — Z0000 Encounter for general adult medical examination without abnormal findings: Secondary | ICD-10-CM

## 2019-07-29 DIAGNOSIS — E785 Hyperlipidemia, unspecified: Secondary | ICD-10-CM | POA: Diagnosis not present

## 2019-07-29 NOTE — Assessment & Plan Note (Signed)
UTD on DEXA.  Check Vit D level and replete prn. 

## 2019-07-29 NOTE — Progress Notes (Signed)
   Subjective:    Patient ID: Melissa Miles, female    DOB: 06-02-1956, 63 y.o.   MRN: [id number]  HPI CPE- UTD on pap, due for mammo and colonoscopy in the next month.  UTD on Tdap, due for flu.   Review of Systems Patient reports no vision/ hearing changes, adenopathy,fever, weight change,  persistant/recurrent hoarseness , swallowing issues, chest pain, palpitations, edema, persistant/recurrent cough, hemoptysis, dyspnea (rest/exertional/paroxysmal nocturnal), gastrointestinal bleeding (melena, rectal bleeding), abdominal pain, significant heartburn, bowel changes, GU symptoms (dysuria, hematuria, incontinence), Gyn symptoms (abnormal  bleeding, pain),  syncope, focal weakness, memory loss, numbness & tingling, skin/hair/nail changes, abnormal bruising or bleeding, anxiety, or depression.     Objective:   Physical Exam General Appearance:    Alert, cooperative, no distress, appears stated age  Head:    Normocephalic, without obvious abnormality, atraumatic  Eyes:    PERRL, conjunctiva/corneas clear, EOM's intact, fundi    benign, both eyes  Ears:    Normal TM's and external ear canals, both ears  Nose:   Deferred due to COVID  Throat:   Neck:   Supple, symmetrical, trachea midline, no adenopathy;    Thyroid: no enlargement/tenderness/nodules  Back:     Symmetric, no curvature, ROM normal, no CVA tenderness  Lungs:     Clear to auscultation bilaterally, respirations unlabored  Chest Wall:    No tenderness or deformity   Heart:    Regular rate and rhythm, S1 and S2 normal, no murmur, rub   or gallop  Breast Exam:    Deferred to GYN  Abdomen:     Soft, non-tender, bowel sounds active all four quadrants,    no masses, no organomegaly  Genitalia:    Deferred to GYN  Rectal:    Extremities:   Extremities normal, atraumatic, no cyanosis or edema  Pulses:   2+ and symmetric all extremities  Skin:   Skin color, texture, turgor normal, no rashes or lesions  Lymph nodes:   Cervical,  supraclavicular, and axillary nodes normal  Neurologic:   CNII-XII intact, normal strength, sensation and reflexes    throughout          Assessment & Plan:

## 2019-07-29 NOTE — Assessment & Plan Note (Signed)
Chronic problem, on Lipitor and Gemfibrozil.  Encouraged healthy diet and regular exercise.  Check labs.  Adjust meds prn

## 2019-07-29 NOTE — Assessment & Plan Note (Signed)
Pt's PE WNL w/ exception of obesity.  UTD on mammo, colonoscopy (both due later this year). UTD on pap.  Check labs.  Anticipatory guidance provided.

## 2019-07-29 NOTE — Patient Instructions (Signed)
Follow up in 6 months to recheck cholesterol and weight loss progress We'll notify you of your lab results and make any changes if needed Continue to work on healthy diet and regular exercise- you can do it!! Call and schedule your mammogram

## 2019-07-29 NOTE — Assessment & Plan Note (Signed)
Ongoing issue for pt.  Stressed need for healthy diet and regular exercise.  Will continue to follow. 

## 2019-07-30 ENCOUNTER — Other Ambulatory Visit: Payer: Self-pay | Admitting: Family Medicine

## 2019-07-30 DIAGNOSIS — R7989 Other specified abnormal findings of blood chemistry: Secondary | ICD-10-CM

## 2019-07-30 LAB — CBC WITH DIFFERENTIAL/PLATELET
Basophils Absolute: 0 10*3/uL (ref 0.0–0.1)
Basophils Relative: 0.7 % (ref 0.0–3.0)
Eosinophils Absolute: 0.2 10*3/uL (ref 0.0–0.7)
Eosinophils Relative: 3.2 % (ref 0.0–5.0)
HCT: 41.2 % (ref 36.0–46.0)
Hemoglobin: 13.7 g/dL (ref 12.0–15.0)
Lymphocytes Relative: 40.6 % (ref 12.0–46.0)
Lymphs Abs: 2.7 10*3/uL (ref 0.7–4.0)
MCHC: 33.3 g/dL (ref 30.0–36.0)
MCV: 89.4 fl (ref 78.0–100.0)
Monocytes Absolute: 0.5 10*3/uL (ref 0.1–1.0)
Monocytes Relative: 8.1 % (ref 3.0–12.0)
Neutro Abs: 3.1 10*3/uL (ref 1.4–7.7)
Neutrophils Relative %: 47.4 % (ref 43.0–77.0)
Platelets: 272 10*3/uL (ref 150.0–400.0)
RBC: 4.61 Mil/uL (ref 3.87–5.11)
RDW: 12.7 % (ref 11.5–15.5)
WBC: 6.6 10*3/uL (ref 4.0–10.5)

## 2019-07-30 LAB — LIPID PANEL
Cholesterol: 178 mg/dL (ref 0–200)
HDL: 39.4 mg/dL (ref >39.00)
LDL Cholesterol: 118 mg/dL — ABNORMAL HIGH (ref 0–99)
NonHDL: 139.03
Total CHOL/HDL Ratio: 5
Triglycerides: 106 mg/dL (ref 0.0–149.0)
VLDL: 21.2 mg/dL (ref 0.0–40.0)

## 2019-07-30 LAB — BASIC METABOLIC PANEL
BUN: 23 mg/dL (ref 6–23)
CO2: 24 mEq/L (ref 19–32)
Calcium: 10.7 mg/dL — ABNORMAL HIGH (ref 8.4–10.5)
Chloride: 103 mEq/L (ref 96–112)
Creatinine, Ser: 0.69 mg/dL (ref 0.40–1.20)
GFR: 86.12 mL/min (ref >60.00)
Glucose, Bld: 95 mg/dL (ref 70–99)
Potassium: 4.2 mEq/L (ref 3.5–5.1)
Sodium: 138 mEq/L (ref 135–145)

## 2019-07-30 LAB — HEPATIC FUNCTION PANEL
ALT: 65 U/L — ABNORMAL HIGH (ref 0–35)
AST: 61 U/L — ABNORMAL HIGH (ref 0–37)
Albumin: 4.7 g/dL (ref 3.5–5.2)
Alkaline Phosphatase: 61 U/L (ref 39–117)
Bilirubin, Direct: 0.1 mg/dL (ref 0.0–0.3)
Total Bilirubin: 0.7 mg/dL (ref 0.2–1.2)
Total Protein: 7.6 g/dL (ref 6.0–8.3)

## 2019-07-30 LAB — TSH: TSH: 1.39 u[IU]/mL (ref 0.35–4.50)

## 2019-07-30 LAB — VITAMIN D 25 HYDROXY (VIT D DEFICIENCY, FRACTURES): VITD: 56.46 ng/mL (ref 30.00–100.00)

## 2019-07-31 ENCOUNTER — Telehealth: Payer: Self-pay | Admitting: Family Medicine

## 2019-07-31 NOTE — Telephone Encounter (Signed)
Patient notified of PCP recommendations and is agreement and expresses an understanding.   Ok for [facility] to Discuss results / PCP recommendations / Schedule patient.   

## 2019-07-31 NOTE — Telephone Encounter (Signed)
A diet high in carbs can also cause elevated liver enzymes (in the form of fatty liver).  So healthy, vegetable/fruit rich diet w/ lean protein (chicken, [place], fish, pork) will improve these numbers

## 2019-07-31 NOTE — Telephone Encounter (Signed)
Please advise 

## 2019-07-31 NOTE — Telephone Encounter (Signed)
Pt called in stating that she doesn't drink alcohol or take tylenol. She wanted to know what else could be causing her liver enzymes to be elevated. Pt can be reached at the home #

## 2019-08-05 ENCOUNTER — Other Ambulatory Visit: Payer: Self-pay | Admitting: Family Medicine

## 2019-08-05 DIAGNOSIS — E785 Hyperlipidemia, unspecified: Secondary | ICD-10-CM

## 2019-08-13 ENCOUNTER — Other Ambulatory Visit: Payer: Self-pay

## 2019-08-13 ENCOUNTER — Ambulatory Visit (INDEPENDENT_AMBULATORY_CARE_PROVIDER_SITE_OTHER): Payer: BC Managed Care – PPO

## 2019-08-13 DIAGNOSIS — R7989 Other specified abnormal findings of blood chemistry: Secondary | ICD-10-CM

## 2019-08-13 LAB — HEPATIC FUNCTION PANEL
ALT: 68 U/L — ABNORMAL HIGH (ref 0–35)
AST: 64 U/L — ABNORMAL HIGH (ref 0–37)
Albumin: 4.9 g/dL (ref 3.5–5.2)
Alkaline Phosphatase: 65 U/L (ref 39–117)
Bilirubin, Direct: 0.1 mg/dL (ref 0.0–0.3)
Total Bilirubin: 0.8 mg/dL (ref 0.2–1.2)
Total Protein: 7.7 g/dL (ref 6.0–8.3)

## 2019-08-14 ENCOUNTER — Other Ambulatory Visit: Payer: Self-pay | Admitting: Family Medicine

## 2019-08-14 DIAGNOSIS — R7989 Other specified abnormal findings of blood chemistry: Secondary | ICD-10-CM

## 2019-08-17 ENCOUNTER — Ambulatory Visit (INDEPENDENT_AMBULATORY_CARE_PROVIDER_SITE_OTHER): Payer: BC Managed Care – PPO | Admitting: Family Medicine

## 2019-08-17 ENCOUNTER — Encounter: Payer: Self-pay | Admitting: Family Medicine

## 2019-08-17 ENCOUNTER — Other Ambulatory Visit: Payer: Self-pay

## 2019-08-17 VITALS — BP 138/86 | HR 104 | Temp 97.9°F | Resp 16 | Wt 194.2 lb

## 2019-08-17 DIAGNOSIS — R222 Localized swelling, mass and lump, trunk: Secondary | ICD-10-CM

## 2019-08-17 NOTE — Progress Notes (Signed)
This visit occurred during the SARS-CoV-2 public health emergency.  Safety protocols were in place, including screening questions prior to the visit, additional usage of staff PPE, and extensive cleaning of exam room while observing appropriate contact time as indicated for disinfecting solutions.

## 2019-08-17 NOTE — Progress Notes (Signed)
   Subjective:    Patient ID: Melissa Miles, female    DOB: 11/11/1955, 63 y.o.   MRN: [id number]  HPI Neck pain- sxs started 'a couple of weeks ago'.  'it felt like a crick or a strain in my neck'.  Was mildly TTP.  Did heavy lifting overhead and then noticed large amount of swelling over L clavicle.  'not painful but squishy'.  Notes a texture difference.  No night sweats, unexplained weight loss, fevers.  Dad has hx of lymphoma and this has pt worried.   Review of Systems For ROS see HPI     Objective:   Physical Exam Vitals signs reviewed.  Constitutional:      General: She is not in acute distress.    Appearance: She is obese. She is not ill-appearing.  HENT:     Head: Normocephalic and atraumatic.  Neck:     Musculoskeletal: Normal range of motion and neck supple. No muscular tenderness.     Comments: Bilateral supraclavicular fullness w/o discrete mass or nodularity, L>R Lymphadenopathy:     Cervical: No cervical adenopathy.  Neurological:     Mental Status: She is alert.           Assessment & Plan:  Supraclavicular fullness- new.  Pt is very nervous as dad had lymphoma.  Neither side feels like a discrete mass and is not fixed.  No TTP.  No systemic sxs.  Will get [place] to assess.  Reviewed supportive care and red flags that should prompt return.  Pt expressed understanding and is in agreement w/ plan.

## 2019-08-24 ENCOUNTER — Encounter: Payer: Self-pay | Admitting: Gastroenterology

## 2019-08-31 ENCOUNTER — Ambulatory Visit
Admission: RE | Admit: 2019-08-31 | Discharge: 2019-08-31 | Disposition: A | Discharge status: Home / Self Care | Payer: BC Managed Care – PPO | Source: Ambulatory Visit | Attending: Family Medicine | Admitting: Family Medicine

## 2019-08-31 DIAGNOSIS — K7689 Other specified diseases of liver: Secondary | ICD-10-CM | POA: Diagnosis not present

## 2019-08-31 DIAGNOSIS — R221 Localized swelling, mass and lump, neck: Secondary | ICD-10-CM | POA: Diagnosis not present

## 2019-08-31 DIAGNOSIS — R222 Localized swelling, mass and lump, trunk: Secondary | ICD-10-CM

## 2019-08-31 DIAGNOSIS — R7989 Other specified abnormal findings of blood chemistry: Secondary | ICD-10-CM

## 2019-08-31 NOTE — Progress Notes (Signed)
Called pt and lmovm to return call.    Ok for [facility] to Discuss results / PCP recommendations / Schedule patient.  

## 2019-09-28 ENCOUNTER — Other Ambulatory Visit: Payer: Self-pay | Admitting: Family Medicine

## 2019-10-29 ENCOUNTER — Other Ambulatory Visit: Payer: Self-pay | Admitting: Family Medicine

## 2019-11-11 DIAGNOSIS — Z01419 Encounter for gynecological examination (general) (routine) without abnormal findings: Secondary | ICD-10-CM | POA: Diagnosis not present

## 2019-11-11 DIAGNOSIS — Z1231 Encounter for screening mammogram for malignant neoplasm of breast: Secondary | ICD-10-CM | POA: Diagnosis not present

## 2019-11-11 DIAGNOSIS — Z6829 Body mass index (BMI) 29.0-29.9, adult: Secondary | ICD-10-CM | POA: Diagnosis not present

## 2019-11-11 LAB — HM MAMMOGRAPHY

## 2019-11-13 ENCOUNTER — Other Ambulatory Visit: Payer: Self-pay | Admitting: Family Medicine

## 2019-11-13 DIAGNOSIS — E785 Hyperlipidemia, unspecified: Secondary | ICD-10-CM

## 2019-11-21 ENCOUNTER — Ambulatory Visit: Payer: BC Managed Care – PPO | Attending: Internal Medicine

## 2019-11-21 DIAGNOSIS — Z23 Encounter for immunization: Secondary | ICD-10-CM | POA: Insufficient documentation

## 2019-11-21 NOTE — Progress Notes (Signed)
   Covid-19 Vaccination Clinic  Name:  Melissa Miles    MRN: [id number] DOB: [birth year]  11/21/2019  [name] was observed post Covid-19 immunization for 15 minutes without incidence. She was provided with Vaccine Information Sheet and instruction to access the V-Safe system.   [name] was instructed to call 911 with any severe reactions post vaccine: Marland Kitchen Difficulty breathing  . Swelling of your face and throat  . A fast heartbeat  . A bad rash all over your body  . Dizziness and weakness    Immunizations Administered    Name Date Dose VIS Date Route   Pfizer COVID-19 Vaccine 11/21/2019 12:20 PM 0.3 mL 09/04/2019 Intramuscular   Manufacturer: [place]   Lot: [id number]   [place]: [id number]

## 2019-11-27 LAB — HM MAMMOGRAPHY: HM Mammogram: NORMAL (ref 0–4)

## 2019-11-27 LAB — HM PAP SMEAR

## 2019-12-12 ENCOUNTER — Ambulatory Visit: Payer: BC Managed Care – PPO | Attending: Internal Medicine

## 2019-12-12 DIAGNOSIS — Z23 Encounter for immunization: Secondary | ICD-10-CM

## 2019-12-12 NOTE — Progress Notes (Signed)
   Covid-19 Vaccination Clinic  Name:  Melissa Miles    MRN: [id number] DOB: 1956/07/09  12/12/2019  [name] was observed post Covid-19 immunization for 15 minutes without incident. She was provided with Vaccine Information Sheet and instruction to access the V-Safe system.   [name] was instructed to call 911 with any severe reactions post vaccine: Marland Kitchen Difficulty breathing  . Swelling of face and throat  . A fast heartbeat  . A bad rash all over body  . Dizziness and weakness   Immunizations Administered    Name Date Dose VIS Date Route   Pfizer COVID-19 Vaccine 12/12/2019 11:40 AM 0.3 mL 09/04/2019 Intramuscular   Manufacturer: [place]   Lot: [id number]   [place]: [id number]

## 2019-12-16 ENCOUNTER — Ambulatory Visit: Payer: BC Managed Care – PPO

## 2019-12-21 ENCOUNTER — Other Ambulatory Visit: Payer: Self-pay | Admitting: Family Medicine

## 2020-01-26 ENCOUNTER — Other Ambulatory Visit: Payer: Self-pay | Admitting: Family Medicine

## 2020-01-26 ENCOUNTER — Other Ambulatory Visit: Payer: Self-pay

## 2020-01-27 ENCOUNTER — Encounter: Payer: Self-pay | Admitting: Family Medicine

## 2020-01-27 ENCOUNTER — Ambulatory Visit: Payer: BC Managed Care – PPO | Admitting: Family Medicine

## 2020-01-27 VITALS — BP 133/83 | HR 90 | Temp 97.8°F | Resp 16 | Ht 68.0 in | Wt 195.2 lb

## 2020-01-27 DIAGNOSIS — E785 Hyperlipidemia, unspecified: Secondary | ICD-10-CM | POA: Diagnosis not present

## 2020-01-27 DIAGNOSIS — F411 Generalized anxiety disorder: Secondary | ICD-10-CM | POA: Diagnosis not present

## 2020-01-27 DIAGNOSIS — E663 Overweight: Secondary | ICD-10-CM

## 2020-01-27 NOTE — Assessment & Plan Note (Signed)
Ongoing issue for pt.  Stressed need for healthy diet and regular exercise.  Will continue to follow. 

## 2020-01-27 NOTE — Assessment & Plan Note (Signed)
Chronic problem.  Tolerating statin w/o difficulty.  Check labs.  Adjust meds prn  

## 2020-01-27 NOTE — Assessment & Plan Note (Signed)
Chronic problem.  Currently well controlled.  No changes at this time

## 2020-01-27 NOTE — Patient Instructions (Signed)
Schedule your complete physical in 6 months We'll notify you of your lab results and make any changes if needed Continue to work on healthy diet and regular exercise- you can do it! Call with any questions or concerns Happy Mother's Day!

## 2020-01-27 NOTE — Progress Notes (Signed)
   Subjective:    Patient ID: [name], female    DOB: [birth year], 64 y.o.   MRN: [id number]  HPI Hyperlipidemia- chronic problem, on Lipitor 40mg  daily and Gemfibrozil 600mg  BID.  Denies CP, SOB, abd pain, N/V.  Overweight- BMI is 29.69  No regular exercise.  Not following a particular diet.  'i've been terrible'.  Anxiety- ongoing issue.  Currently on Cymbalta 60mg .  Feels that sxs are well controlled most of the time.  Will still have occasional breakthrough anxiety but overall is pleased.  Review of Systems For ROS see HPI   This visit occurred during the SARS-CoV-2 public health emergency.  Safety protocols were in place, including screening questions prior to the visit, additional usage of staff PPE, and extensive cleaning of exam room while observing appropriate contact time as indicated for disinfecting solutions.       Objective:   Physical Exam Vitals reviewed.  Constitutional:      General: She is not in acute distress.    Appearance: She is well-developed. She is obese.  HENT:     Head: Normocephalic and atraumatic.  Eyes:     Conjunctiva/sclera: Conjunctivae normal.     Pupils: Pupils are equal, round, and reactive to light.  Neck:     Thyroid: No thyromegaly.  Cardiovascular:     Rate and Rhythm: Normal rate and regular rhythm.     Heart sounds: Normal heart sounds. No murmur.  Pulmonary:     Effort: Pulmonary effort is normal. No respiratory distress.     Breath sounds: Normal breath sounds.  Abdominal:     General: There is no distension.     Palpations: Abdomen is soft.     Tenderness: There is no abdominal tenderness.  Musculoskeletal:     Cervical back: Normal range of motion and neck supple.  Lymphadenopathy:     Cervical: No cervical adenopathy.  Skin:    General: Skin is warm and dry.  Neurological:     Mental Status: She is alert and oriented to person, place, and time.  Psychiatric:        Behavior: Behavior normal.             Assessment & Plan:

## 2020-01-28 LAB — HEPATIC FUNCTION PANEL
ALT: 56 U/L — ABNORMAL HIGH (ref 0–35)
AST: 48 U/L — ABNORMAL HIGH (ref 0–37)
Albumin: 4.6 g/dL (ref 3.5–5.2)
Alkaline Phosphatase: 59 U/L (ref 39–117)
Bilirubin, Direct: 0.2 mg/dL (ref 0.0–0.3)
Total Bilirubin: 0.7 mg/dL (ref 0.2–1.2)
Total Protein: 7.4 g/dL (ref 6.0–8.3)

## 2020-01-28 LAB — CBC WITH DIFFERENTIAL/PLATELET
Basophils Absolute: 0 10*3/uL (ref 0.0–0.1)
Basophils Relative: 0.6 % (ref 0.0–3.0)
Eosinophils Absolute: 0.2 10*3/uL (ref 0.0–0.7)
Eosinophils Relative: 2.9 % (ref 0.0–5.0)
HCT: 39.5 % (ref 36.0–46.0)
Hemoglobin: 13.5 g/dL (ref 12.0–15.0)
Lymphocytes Relative: 35.5 % (ref 12.0–46.0)
Lymphs Abs: 2.3 10*3/uL (ref 0.7–4.0)
MCHC: 34.1 g/dL (ref 30.0–36.0)
MCV: 90 fl (ref 78.0–100.0)
Monocytes Absolute: 0.4 10*3/uL (ref 0.1–1.0)
Monocytes Relative: 6.4 % (ref 3.0–12.0)
Neutro Abs: 3.6 10*3/uL (ref 1.4–7.7)
Neutrophils Relative %: 54.6 % (ref 43.0–77.0)
Platelets: 245 10*3/uL (ref 150.0–400.0)
RBC: 4.39 Mil/uL (ref 3.87–5.11)
RDW: 13 % (ref 11.5–15.5)
WBC: 6.6 10*3/uL (ref 4.0–10.5)

## 2020-01-28 LAB — LIPID PANEL
Cholesterol: 142 mg/dL (ref 0–200)
HDL: 39.7 mg/dL (ref >39.00)
LDL Cholesterol: 86 mg/dL (ref 0–99)
NonHDL: 102.46
Total CHOL/HDL Ratio: 4
Triglycerides: 82 mg/dL (ref 0.0–149.0)
VLDL: 16.4 mg/dL (ref 0.0–40.0)

## 2020-01-28 LAB — BASIC METABOLIC PANEL
BUN: 19 mg/dL (ref 6–23)
CO2: 23 mEq/L (ref 19–32)
Calcium: 10.6 mg/dL — ABNORMAL HIGH (ref 8.4–10.5)
Chloride: 103 mEq/L (ref 96–112)
Creatinine, Ser: 0.71 mg/dL (ref 0.40–1.20)
GFR: 83.19 mL/min (ref >60.00)
Glucose, Bld: 94 mg/dL (ref 70–99)
Potassium: 4 mEq/L (ref 3.5–5.1)
Sodium: 136 mEq/L (ref 135–145)

## 2020-01-28 LAB — TSH: TSH: 1.36 u[IU]/mL (ref 0.35–4.50)

## 2020-02-01 ENCOUNTER — Encounter: Payer: Self-pay | Admitting: Gastroenterology

## 2020-02-12 ENCOUNTER — Other Ambulatory Visit: Payer: Self-pay | Admitting: Family Medicine

## 2020-02-12 DIAGNOSIS — E785 Hyperlipidemia, unspecified: Secondary | ICD-10-CM

## 2020-03-11 ENCOUNTER — Other Ambulatory Visit: Payer: Self-pay

## 2020-03-11 ENCOUNTER — Ambulatory Visit (AMBULATORY_SURGERY_CENTER): Payer: Self-pay | Admitting: *Deleted

## 2020-03-11 VITALS — Ht 68.0 in | Wt 199.6 lb

## 2020-03-11 DIAGNOSIS — Z1211 Encounter for screening for malignant neoplasm of colon: Secondary | ICD-10-CM

## 2020-03-11 MED ORDER — SUTAB 1479-225-188 MG PO TABS: 1.0000 | ORAL_TABLET | Freq: Once | ORAL | 0 refills | Status: AC

## 2020-03-11 NOTE — Progress Notes (Signed)
2nd dose of covid vaccine 12-12-19  Pt is aware that care partner will wait in the car during procedure; if they feel like they will be too hot or cold to wait in the car; they may wait in the 4 th floor lobby. Patient is aware to bring only one care partner. We want them to wear a mask (we do not have any that we can provide them), practice social distancing, and we will check their temperatures when they get here.  I did remind the patient that their care partner needs to stay in the parking lot the entire time and have a cell phone available, we will call them when the pt is ready for discharge. Patient will wear mask into building.   No trouble with anesthesia, difficulty with intubation or hx/fam hx of malignant hyperthermia per pt   No egg or soy allergy  No home oxygen use   No medications for weight loss taken  Pt denies constipation issues   Sutab code put into RX and paper copy given to pt to show pharmacy

## 2020-03-24 DIAGNOSIS — D126 Benign neoplasm of colon, unspecified: Secondary | ICD-10-CM

## 2020-03-24 HISTORY — DX: Benign neoplasm of colon, unspecified: D12.6

## 2020-03-25 ENCOUNTER — Encounter: Payer: Self-pay | Admitting: Gastroenterology

## 2020-03-25 ENCOUNTER — Other Ambulatory Visit: Payer: Self-pay

## 2020-03-25 ENCOUNTER — Ambulatory Visit (AMBULATORY_SURGERY_CENTER): Payer: BC Managed Care – PPO | Admitting: Gastroenterology

## 2020-03-25 VITALS — BP 126/69 | HR 81 | Temp 97.3°F | Resp 18 | Ht 68.0 in | Wt 199.0 lb

## 2020-03-25 DIAGNOSIS — Z1211 Encounter for screening for malignant neoplasm of colon: Secondary | ICD-10-CM | POA: Diagnosis not present

## 2020-03-25 DIAGNOSIS — D122 Benign neoplasm of ascending colon: Secondary | ICD-10-CM | POA: Diagnosis not present

## 2020-03-25 MED ORDER — SODIUM CHLORIDE 0.9 % IV SOLN: 500.0000 mL | Freq: Once | INTRAVENOUS | Status: DC

## 2020-03-25 NOTE — Op Note (Signed)
[place] Patient Name: Melissa Miles Procedure Date: 03/25/2020 2:11 PM MRN: [id number] Endoscopist: Ladene Artist , MD Age: 64 Referring MD:  Date of Birth: 1956/07/30 Gender: Female Account #: [id number] Procedure:                Colonoscopy Indications:              Screening for colorectal malignant neoplasm Medicines:                Monitored Anesthesia Care Procedure:                Pre-Anesthesia Assessment:                           - Prior to the procedure, a History and Physical                            was performed, and patient medications and                            allergies were reviewed. The patient's tolerance of                            previous anesthesia was also reviewed. The risks                            and benefits of the procedure and the sedation                            options and risks were discussed with the patient.                            All questions were answered, and informed consent                            was obtained. Prior Anticoagulants: The patient has                            taken no previous anticoagulant or antiplatelet                            agents. ASA Grade Assessment: II - A patient with                            mild systemic disease. After reviewing the risks                            and benefits, the patient was deemed in                            satisfactory condition to undergo the procedure.                           After obtaining informed consent, the colonoscope  was passed under direct vision. Throughout the                            procedure, the patient's blood pressure, pulse, and                            oxygen saturations were monitored continuously. The                            Colonoscope was introduced through the anus and                            advanced to the the cecum, identified by                            appendiceal orifice and  ileocecal valve. The                            ileocecal valve, appendiceal orifice, and rectum                            were photographed. The quality of the bowel                            preparation was excellent. The colonoscopy was                            performed without difficulty. The patient tolerated                            the procedure well. Scope In: 2:19:33 PM Scope Out: 2:31:55 PM Scope Withdrawal Time: 0 hours 10 minutes 2 seconds  Total Procedure Duration: 0 hours 12 minutes 22 seconds  Findings:                 The perianal and digital rectal examinations were                            normal.                           A 7 mm polyp was found in the ascending colon. The                            polyp was sessile. The polyp was removed with a                            cold snare. Resection and retrieval were complete.                           The exam was otherwise without abnormality on                            direct and retroflexion views. Complications:  No immediate complications. Estimated blood loss:                            None. Estimated Blood Loss:     Estimated blood loss: none. Impression:               - One 7 mm polyp in the ascending colon, removed                            with a cold snare. Resected and retrieved.                           - The examination was otherwise normal on direct                            and retroflexion views. Recommendation:           - Repeat colonoscopy after studies are complete for                            surveillance based on pathology results.                           - Patient has a contact number available for                            emergencies. The signs and symptoms of potential                            delayed complications were discussed with the                            patient. Return to normal activities tomorrow.                            Written discharge  instructions were provided to the                            patient.                           - Resume previous diet.                           - Continue present medications.                           - Await pathology results. Ladene Artist, MD 03/25/2020 2:34:10 PM This report has been signed electronically.

## 2020-03-25 NOTE — Progress Notes (Signed)
Called to room to assist during endoscopic procedure.  Patient ID and intended procedure confirmed with present staff. Received instructions for my participation in the procedure from the performing physician.  

## 2020-03-25 NOTE — Progress Notes (Signed)
Vitals-VV  Pt's states no medical or surgical changes since previsit or office visit.

## 2020-03-25 NOTE — Patient Instructions (Signed)
Thank you for allowing [place] to care for you today!  Await biopsy results of polyp removed, approximately 7-10 days by mail.  Will make recommendation at that time for next colonoscopy.  Resume previous diet and medications today.  Return to your normal activities tomorrow.   YOU HAD AN ENDOSCOPIC PROCEDURE TODAY AT [facility]:   Refer to the procedure report that was given to you for any specific questions about what was found during the examination.  If the procedure report does not answer your questions, please call your gastroenterologist to clarify.  If you requested that your care partner not be given the details of your procedure findings, then the procedure report has been included in a sealed envelope for you to review at your convenience later.  YOU SHOULD EXPECT: Some feelings of bloating in the abdomen. Passage of more gas than usual.  Walking can help get rid of the air that was put into your GI tract during the procedure and reduce the bloating. If you had a lower endoscopy (such as a colonoscopy or flexible sigmoidoscopy) you may notice spotting of blood in your stool or on the toilet paper. If you underwent a bowel prep for your procedure, you may not have a normal bowel movement for a few days.  Please Note:  You might notice some irritation and congestion in your nose or some drainage.  This is from the oxygen used during your procedure.  There is no need for concern and it should clear up in a day or so.  SYMPTOMS TO REPORT IMMEDIATELY:   Following lower endoscopy (colonoscopy or flexible sigmoidoscopy):  Excessive amounts of blood in the stool  Significant tenderness or worsening of abdominal pains  Swelling of the abdomen that is new, acute  Fever of 100F or higher   For urgent or emergent issues, a gastroenterologist can be reached at any hour by calling [phone]. Do not use MyChart messaging for urgent concerns.    DIET:  We do recommend a small  meal at first, but then you may proceed to your regular diet.  Drink plenty of fluids but you should avoid alcoholic beverages for 24 hours.  ACTIVITY:  You should plan to take it easy for the rest of today and you should NOT DRIVE or use heavy machinery until tomorrow (because of the sedation medicines used during the test).    FOLLOW UP: Our staff will call the number listed on your records 48-72 hours following your procedure to check on you and address any questions or concerns that you may have regarding the information given to you following your procedure. If we do not reach you, we will leave a message.  We will attempt to reach you two times.  During this call, we will ask if you have developed any symptoms of COVID 19. If you develop any symptoms (ie: fever, flu-like symptoms, shortness of breath, cough etc.) before then, please call [phone].  If you test positive for Covid 19 in the 2 weeks post procedure, please call and report this information to [place].    If any biopsies were taken you will be contacted by phone or by letter within the next 1-3 weeks.  Please call us at [phone] if you have not heard about the biopsies in 3 weeks.    SIGNATURES/CONFIDENTIALITY: You and/or your care partner have signed paperwork which will be entered into your electronic medical record.  These signatures attest to the fact that that  the information above on your After Visit Summary has been reviewed and is understood.  Full responsibility of the confidentiality of this discharge information lies with you and/or your care-partner.

## 2020-03-25 NOTE — Progress Notes (Signed)
A/ox3, pleased with MAC, report to RN 

## 2020-03-30 ENCOUNTER — Telehealth: Payer: Self-pay | Admitting: *Deleted

## 2020-03-30 NOTE — Telephone Encounter (Signed)
  Follow up Call-  Call back number 03/25/2020  Post procedure Call Back phone  # [phone]  Permission to leave phone message Yes  Some recent data might be hidden     Patient questions:  Do you have a fever, pain , or abdominal swelling? No. Pain Score  0 *  Have you tolerated food without any problems? Yes.    Have you been able to return to your normal activities? Yes.    Do you have any questions about your discharge instructions: Diet   No. Medications  No. Follow up visit  No.  Do you have questions or concerns about your Care? No.  Actions: * If pain score is 4 or above: No action needed, pain <4.  1. Have you developed a fever since your procedure? no  2.   Have you had an respiratory symptoms (SOB or cough) since your procedure? no  3.   Have you tested positive for COVID 19 since your procedure no  4.   Have you had any family members/close contacts diagnosed with the COVID 19 since your procedure?  no   If yes to any of these questions please route to Joylene John, RN and Erenest Rasher, RN

## 2020-04-06 ENCOUNTER — Encounter: Payer: Self-pay | Admitting: Gastroenterology

## 2020-04-13 ENCOUNTER — Other Ambulatory Visit: Payer: Self-pay | Admitting: Family Medicine

## 2020-05-18 ENCOUNTER — Other Ambulatory Visit: Payer: Self-pay | Admitting: Family Medicine

## 2020-05-18 DIAGNOSIS — E785 Hyperlipidemia, unspecified: Secondary | ICD-10-CM

## 2020-07-14 ENCOUNTER — Other Ambulatory Visit: Payer: Self-pay | Admitting: Family Medicine

## 2020-08-03 ENCOUNTER — Ambulatory Visit (INDEPENDENT_AMBULATORY_CARE_PROVIDER_SITE_OTHER): Payer: BC Managed Care – PPO | Admitting: Family Medicine

## 2020-08-03 ENCOUNTER — Encounter: Payer: Self-pay | Admitting: Family Medicine

## 2020-08-03 ENCOUNTER — Encounter: Payer: BC Managed Care – PPO | Admitting: Family Medicine

## 2020-08-03 ENCOUNTER — Other Ambulatory Visit: Payer: Self-pay

## 2020-08-03 VITALS — BP 128/70 | HR 91 | Temp 98.2°F | Resp 17 | Ht 68.0 in | Wt 198.6 lb

## 2020-08-03 DIAGNOSIS — M8580 Other specified disorders of bone density and structure, unspecified site: Secondary | ICD-10-CM | POA: Diagnosis not present

## 2020-08-03 DIAGNOSIS — Z0000 Encounter for general adult medical examination without abnormal findings: Secondary | ICD-10-CM | POA: Diagnosis not present

## 2020-08-03 DIAGNOSIS — Z1159 Encounter for screening for other viral diseases: Secondary | ICD-10-CM | POA: Diagnosis not present

## 2020-08-03 DIAGNOSIS — E785 Hyperlipidemia, unspecified: Secondary | ICD-10-CM

## 2020-08-03 DIAGNOSIS — Z23 Encounter for immunization: Secondary | ICD-10-CM | POA: Diagnosis not present

## 2020-08-03 DIAGNOSIS — Z114 Encounter for screening for human immunodeficiency virus [HIV]: Secondary | ICD-10-CM | POA: Diagnosis not present

## 2020-08-03 LAB — CBC WITH DIFFERENTIAL/PLATELET
Basophils Absolute: 0 K/uL (ref 0.0–0.1)
Basophils Relative: 0.6 % (ref 0.0–3.0)
Eosinophils Absolute: 0.5 K/uL (ref 0.0–0.7)
Eosinophils Relative: 6.9 % — ABNORMAL HIGH (ref 0.0–5.0)
HCT: 41.6 % (ref 36.0–46.0)
Hemoglobin: 13.9 g/dL (ref 12.0–15.0)
Lymphocytes Relative: 36.3 % (ref 12.0–46.0)
Lymphs Abs: 2.4 K/uL (ref 0.7–4.0)
MCHC: 33.5 g/dL (ref 30.0–36.0)
MCV: 91.2 fl (ref 78.0–100.0)
Monocytes Absolute: 0.5 K/uL (ref 0.1–1.0)
Monocytes Relative: 7 % (ref 3.0–12.0)
Neutro Abs: 3.2 K/uL (ref 1.4–7.7)
Neutrophils Relative %: 49.2 % (ref 43.0–77.0)
Platelets: 248 K/uL (ref 150.0–400.0)
RBC: 4.56 Mil/uL (ref 3.87–5.11)
RDW: 13.1 % (ref 11.5–15.5)
WBC: 6.6 K/uL (ref 4.0–10.5)

## 2020-08-03 LAB — LIPID PANEL
Cholesterol: 154 mg/dL (ref 0–200)
HDL: 46.7 mg/dL (ref >39.00)
LDL Cholesterol: 91 mg/dL (ref 0–99)
NonHDL: 107.07
Total CHOL/HDL Ratio: 3
Triglycerides: 78 mg/dL (ref 0.0–149.0)
VLDL: 15.6 mg/dL (ref 0.0–40.0)

## 2020-08-03 LAB — BASIC METABOLIC PANEL
BUN: 20 mg/dL (ref 6–23)
CO2: 29 mEq/L (ref 19–32)
Calcium: 11.8 mg/dL — ABNORMAL HIGH (ref 8.4–10.5)
Chloride: 103 mEq/L (ref 96–112)
Creatinine, Ser: 0.82 mg/dL (ref 0.40–1.20)
GFR: 76.17 mL/min (ref >60.00)
Glucose, Bld: 90 mg/dL (ref 70–99)
Potassium: 4.1 mEq/L (ref 3.5–5.1)
Sodium: 139 mEq/L (ref 135–145)

## 2020-08-03 LAB — HEPATIC FUNCTION PANEL
ALT: 99 U/L — ABNORMAL HIGH (ref 0–35)
AST: 89 U/L — ABNORMAL HIGH (ref 0–37)
Albumin: 4.8 g/dL (ref 3.5–5.2)
Alkaline Phosphatase: 61 U/L (ref 39–117)
Bilirubin, Direct: 0.1 mg/dL (ref 0.0–0.3)
Total Bilirubin: 0.7 mg/dL (ref 0.2–1.2)
Total Protein: 7.6 g/dL (ref 6.0–8.3)

## 2020-08-03 LAB — VITAMIN D 25 HYDROXY (VIT D DEFICIENCY, FRACTURES): VITD: 59.29 ng/mL (ref 30.00–100.00)

## 2020-08-03 LAB — TSH: TSH: 1.36 u[IU]/mL (ref 0.35–4.50)

## 2020-08-03 NOTE — Progress Notes (Signed)
   Subjective:    Patient ID: Melissa Miles, female    DOB: 01-16-1956, 64 y.o.   MRN: [id number]  HPI CPE- UTD on pap, mammo, colonoscopy, COVID.  Due for flu and Tdap.  Reviewed past medical, surgical, family and social histories.   Patient Care Team    Relationship Specialty Notifications Start End  Midge Minium, MD PCP - General Family Medicine  01/06/14   Delila Pereyra, MD Consulting Physician Gynecology  07/20/15   Ladene Artist, MD Consulting Physician Gastroenterology  07/29/19     Health Maintenance  Topic Date Due  . Hepatitis C Screening  Never done  . HIV Screening  Never done  . INFLUENZA VACCINE  04/24/2020  . TETANUS/TDAP  01/26/2021 (Originally 01/25/2020)  . DEXA SCAN  08/04/2020  . MAMMOGRAM  11/26/2020  . PAP SMEAR-Modifier  11/27/2022  . COLONOSCOPY  03/26/2027  . COVID-19 Vaccine  Completed      Review of Systems Patient reports no vision/ hearing changes, adenopathy,fever, weight change,  persistant/recurrent hoarseness , swallowing issues, chest pain, palpitations, edema, persistant/recurrent cough, hemoptysis, dyspnea (rest/exertional/paroxysmal nocturnal), gastrointestinal bleeding (melena, rectal bleeding), abdominal pain, significant heartburn, bowel changes, GU symptoms (dysuria, hematuria, incontinence), Gyn symptoms (abnormal  bleeding, pain),  syncope, focal weakness, memory loss, numbness & tingling, skin/hair/nail changes, abnormal bruising or bleeding, anxiety, or depression.   This visit occurred during the SARS-CoV-2 public health emergency.  Safety protocols were in place, including screening questions prior to the visit, additional usage of staff PPE, and extensive cleaning of exam room while observing appropriate contact time as indicated for disinfecting solutions.       Objective:   Physical Exam General Appearance:    Alert, cooperative, no distress, appears stated age  Head:    Normocephalic, without obvious abnormality,  atraumatic  Eyes:    PERRL, conjunctiva/corneas clear, EOM's intact, fundi    benign, both eyes  Ears:    Normal TM's and external ear canals, both ears  Nose:   Deferred due to COVID  Throat:   Neck:   Supple, symmetrical, trachea midline, no adenopathy;    Thyroid: no enlargement/tenderness/nodules  Back:     Symmetric, no curvature, ROM normal, no CVA tenderness  Lungs:     Clear to auscultation bilaterally, respirations unlabored  Chest Wall:    No tenderness or deformity   Heart:    Regular rate and rhythm, S1 and S2 normal, no murmur, rub   or gallop  Breast Exam:    Deferred to GYN  Abdomen:     Soft, non-tender, bowel sounds active all four quadrants,    no masses, no organomegaly  Genitalia:    Deferred to GYN  Rectal:    Extremities:   Extremities normal, atraumatic, no cyanosis or edema  Pulses:   2+ and symmetric all extremities  Skin:   Skin color, texture, turgor normal, no rashes or lesions  Lymph nodes:   Cervical, supraclavicular, and axillary nodes normal  Neurologic:   CNII-XII intact, normal strength, sensation and reflexes    throughout          Assessment & Plan:

## 2020-08-03 NOTE — Assessment & Plan Note (Signed)
Pt's PE WNL w/ exception of obesity.  UTD on pap, mammo, colonoscopy, COVID vaccines.  Tdap and Flu given today.  Check labs.  Anticipatory guidance provided.

## 2020-08-03 NOTE — Patient Instructions (Signed)
Follow up in 6 months to recheck cholesterol We'll notify you of your lab results and make any changes if needed Continue to work on healthy diet and regular exercise- you can do it! Call with any questions or concerns Stay Safe!  Stay Healthy! 

## 2020-08-03 NOTE — Assessment & Plan Note (Signed)
UTD on DEXA.  Check Vit D level and replete prn. 

## 2020-08-03 NOTE — Assessment & Plan Note (Signed)
Chronic problem.  Check labs and adjust meds prn. 

## 2020-08-04 ENCOUNTER — Other Ambulatory Visit: Payer: Self-pay | Admitting: Emergency Medicine

## 2020-08-04 DIAGNOSIS — R7989 Other specified abnormal findings of blood chemistry: Secondary | ICD-10-CM

## 2020-08-04 LAB — HEPATITIS C ANTIBODY
Hepatitis C Ab: NONREACTIVE
SIGNAL TO CUT-OFF: 0.01 (ref <1.00)

## 2020-08-04 LAB — HIV ANTIBODY (ROUTINE TESTING W REFLEX): HIV 1&2 Ab, 4th Generation: NONREACTIVE

## 2020-08-08 ENCOUNTER — Other Ambulatory Visit: Payer: Self-pay

## 2020-08-08 ENCOUNTER — Ambulatory Visit (INDEPENDENT_AMBULATORY_CARE_PROVIDER_SITE_OTHER): Payer: BC Managed Care – PPO

## 2020-08-09 ENCOUNTER — Telehealth: Payer: Self-pay

## 2020-08-09 LAB — PARATHYROID HORMONE, INTACT (NO CA): PTH: 55 pg/mL (ref 14–64)

## 2020-08-09 NOTE — Telephone Encounter (Signed)
Patient returned call about labs. Patient voiced understanding. No other concerns at this time.

## 2020-08-22 ENCOUNTER — Other Ambulatory Visit: Payer: Self-pay | Admitting: Family Medicine

## 2020-08-22 DIAGNOSIS — E785 Hyperlipidemia, unspecified: Secondary | ICD-10-CM

## 2020-10-18 ENCOUNTER — Ambulatory Visit: Payer: BC Managed Care – PPO | Admitting: Gastroenterology

## 2020-10-18 ENCOUNTER — Encounter: Payer: Self-pay | Admitting: Gastroenterology

## 2020-10-18 ENCOUNTER — Other Ambulatory Visit: Payer: Self-pay

## 2020-10-18 ENCOUNTER — Other Ambulatory Visit (INDEPENDENT_AMBULATORY_CARE_PROVIDER_SITE_OTHER): Payer: BC Managed Care – PPO

## 2020-10-18 VITALS — BP 140/80 | HR 104 | Ht 67.5 in | Wt 200.0 lb

## 2020-10-18 DIAGNOSIS — K760 Fatty (change of) liver, not elsewhere classified: Secondary | ICD-10-CM | POA: Diagnosis not present

## 2020-10-18 DIAGNOSIS — R7401 Elevation of levels of liver transaminase levels: Secondary | ICD-10-CM

## 2020-10-18 LAB — IBC + FERRITIN
Ferritin: 116 ng/mL (ref 10.0–291.0)
Iron: 120 ug/dL (ref 42–145)
Saturation Ratios: 25.9 % (ref 20.0–50.0)
Transferrin: 331 mg/dL (ref 212.0–360.0)

## 2020-10-18 LAB — PROTIME-INR
INR: 1.1 ratio — ABNORMAL HIGH (ref 0.8–1.0)
Prothrombin Time: 12.1 s (ref 9.6–13.1)

## 2020-10-18 NOTE — Patient Instructions (Addendum)
Please remain on a low fat/low carb diet.   Your provider has requested that you go to the basement level for lab work before leaving today. Press "B" on the elevator. The lab is located at the first door on the left as you exit the elevator.  Due to recent changes in healthcare laws, you may see the results of your imaging and laboratory studies on MyChart before your provider has had a chance to review them.  We understand that in some cases there may be results that are confusing or concerning to you. Not all laboratory results come back in the same time frame and the provider may be waiting for multiple results in order to interpret others.  Please give [place] 48 hours in order for your provider to thoroughly review all the results before contacting the office for clarification of your results.   Thank you for choosing me and [facility].  Pricilla Riffle. Dagoberto Ligas., MD., Mar[facility] Regal

## 2020-10-18 NOTE — Progress Notes (Signed)
    History of Present Illness: This is a 65 year old female referred by Dr. Birdie Riddle for elevated transaminases. She has elevated transaminases dating back to 2016. Prior to that they were normal. More recently her transaminases slightly increased to AST=89 and ALT=99. She has been treated for hyperlipidemia is currently maintained on Lipitor and Lopid. RUQ [place] in December 2012 showed prior cholecystectomy, CBD=5 mm, an echogenic liver with a coarse echotexture, suspected hepatic steatosis. Very rare alcohol use.  Current Medications, Allergies, Past Medical History, Past Surgical History, Family History and Social History were reviewed in [employer] record.   Physical Exam: General: Well developed, well nourished, no acute distress Head: Normocephalic and atraumatic Eyes:  sclerae anicteric, EOMI Ears: Normal auditory acuity Mouth: Not examined, mask on during Covid-19 pandemic Lungs: Clear throughout to auscultation Heart: Regular rate and rhythm; no murmurs, rubs or bruits Abdomen: Soft, non tender and non distended. No masses, hepatosplenomegaly or hernias noted. Normal Bowel sounds Rectal: Not done Musculoskeletal: Symmetrical with no gross deformities  Pulses:  Normal pulses noted Extremities: No clubbing, cyanosis, edema or deformities noted Neurological: Alert oriented x 4, grossly nonfocal Psychological:  Alert and cooperative. Normal mood and affect   Assessment and Recommendations:  1. Elevated transaminases likely due to hepatic steatosis. HCV Ab was negative. Rule out other causes of elevated LFTs. Lipitor related side effect appears less likely but is possible. Rule out genetic and other required liver diseases. Send standard hepatic serologies. Long-term optimal control of hyperlipidemia. Long-term carb modified, fat modified, weight loss program supervised by her PCP. Monitor LFTs approximately every 6 months per PCP. If the serologic work-up is  unremarkable and LFTs remain elevated consider liver biopsy in the future.  2. Personal history of adenomatous colon polyps. A 7-year interval surveillance colonoscopy is recommended in July 2028.

## 2020-10-19 ENCOUNTER — Telehealth: Payer: Self-pay

## 2020-10-19 NOTE — Telephone Encounter (Signed)
Spoke to patient and gave lab results

## 2020-10-19 NOTE — Telephone Encounter (Signed)
-----   Message from Ladene Artist, MD sent at 10/19/2020  1:38 PM EST ----- Hep B markers and tTG are negative Several results are pending

## 2020-10-19 NOTE — Telephone Encounter (Signed)
Left message for patient to please call back. 

## 2020-10-21 ENCOUNTER — Telehealth: Payer: Self-pay | Admitting: Gastroenterology

## 2020-10-21 NOTE — Telephone Encounter (Signed)
Pt is returning a missed call regarding her results.

## 2020-10-21 NOTE — Telephone Encounter (Signed)
See results notes for details.  

## 2020-10-23 LAB — ANA: Anti Nuclear Antibody (ANA): NEGATIVE

## 2020-10-23 LAB — ANTI-SMOOTH MUSCLE ANTIBODY, IGG: Actin (Smooth Muscle) Antibody (IGG): <20 U (ref <20)

## 2020-10-23 LAB — HEPATITIS B SURFACE ANTIBODY,QUALITATIVE: Hep B S Ab: NONREACTIVE

## 2020-10-23 LAB — MITOCHONDRIAL ANTIBODIES: Mitochondrial M2 Ab, IgG: <=20 U

## 2020-10-23 LAB — IGA: Immunoglobulin A: 267 mg/dL (ref 70–320)

## 2020-10-23 LAB — TISSUE TRANSGLUTAMINASE, IGA: (tTG) Ab, IgA: <1 U/mL

## 2020-10-23 LAB — CERULOPLASMIN: Ceruloplasmin: 28 mg/dL (ref 18–53)

## 2020-10-23 LAB — HEPATITIS B SURFACE ANTIGEN: Hepatitis B Surface Ag: NONREACTIVE

## 2020-10-23 LAB — ALPHA-1-ANTITRYPSIN: A-1 Antitrypsin, Ser: 161 mg/dL (ref 83–199)

## 2020-10-25 ENCOUNTER — Telehealth: Payer: Self-pay

## 2020-10-25 NOTE — Telephone Encounter (Signed)
-----   Message from Ladene Artist, MD sent at 10/24/2020  6:17 PM EST ----- ASMA is negative. So all hepatic serologies are negative or normal  See office note. Hepatic steatosis is the likely cause of her transaminase elevations

## 2020-10-25 NOTE — Telephone Encounter (Signed)
Called patient back again,  Gave lab results

## 2020-10-25 NOTE — Telephone Encounter (Signed)
Left message for patient to please call back. 

## 2020-10-25 NOTE — Telephone Encounter (Signed)
Inbound call from patient returning your call. 

## 2020-10-27 ENCOUNTER — Other Ambulatory Visit: Payer: Self-pay | Admitting: Family Medicine

## 2020-11-22 ENCOUNTER — Other Ambulatory Visit: Payer: Self-pay | Admitting: Family Medicine

## 2020-11-22 DIAGNOSIS — E785 Hyperlipidemia, unspecified: Secondary | ICD-10-CM

## 2021-01-25 ENCOUNTER — Other Ambulatory Visit: Payer: Self-pay

## 2021-01-25 ENCOUNTER — Other Ambulatory Visit: Payer: Self-pay | Admitting: Family Medicine

## 2021-01-25 ENCOUNTER — Ambulatory Visit: Payer: BC Managed Care – PPO | Admitting: Family Medicine

## 2021-01-25 ENCOUNTER — Encounter: Payer: Self-pay | Admitting: Family Medicine

## 2021-01-25 VITALS — BP 128/70 | HR 85 | Temp 99.0°F | Resp 17 | Ht 67.5 in | Wt 195.4 lb

## 2021-01-25 DIAGNOSIS — E785 Hyperlipidemia, unspecified: Secondary | ICD-10-CM | POA: Diagnosis not present

## 2021-01-25 DIAGNOSIS — E669 Obesity, unspecified: Secondary | ICD-10-CM

## 2021-01-25 DIAGNOSIS — E210 Primary hyperparathyroidism: Secondary | ICD-10-CM | POA: Insufficient documentation

## 2021-01-25 LAB — CBC WITH DIFFERENTIAL/PLATELET
Basophils Absolute: 0 10*3/uL (ref 0.0–0.1)
Basophils Relative: 0.4 % (ref 0.0–3.0)
Eosinophils Absolute: 0.2 10*3/uL (ref 0.0–0.7)
Eosinophils Relative: 3.7 % (ref 0.0–5.0)
HCT: 39.7 % (ref 36.0–46.0)
Hemoglobin: 13.5 g/dL (ref 12.0–15.0)
Lymphocytes Relative: 34.4 % (ref 12.0–46.0)
Lymphs Abs: 2.2 10*3/uL (ref 0.7–4.0)
MCHC: 34 g/dL (ref 30.0–36.0)
MCV: 90.3 fl (ref 78.0–100.0)
Monocytes Absolute: 0.5 10*3/uL (ref 0.1–1.0)
Monocytes Relative: 7.3 % (ref 3.0–12.0)
Neutro Abs: 3.5 10*3/uL (ref 1.4–7.7)
Neutrophils Relative %: 54.2 % (ref 43.0–77.0)
Platelets: 229 10*3/uL (ref 150.0–400.0)
RBC: 4.4 Mil/uL (ref 3.87–5.11)
RDW: 13.1 % (ref 11.5–15.5)
WBC: 6.5 10*3/uL (ref 4.0–10.5)

## 2021-01-25 LAB — BASIC METABOLIC PANEL
BUN: 25 mg/dL — ABNORMAL HIGH (ref 6–23)
CO2: 28 mEq/L (ref 19–32)
Calcium: 12.3 mg/dL — ABNORMAL HIGH (ref 8.4–10.5)
Chloride: 104 mEq/L (ref 96–112)
Creatinine, Ser: 0.92 mg/dL (ref 0.40–1.20)
GFR: 66.13 mL/min (ref >60.00)
Glucose, Bld: 88 mg/dL (ref 70–99)
Potassium: 4 mEq/L (ref 3.5–5.1)
Sodium: 141 mEq/L (ref 135–145)

## 2021-01-25 LAB — LIPID PANEL
Cholesterol: 155 mg/dL (ref 0–200)
HDL: 37.7 mg/dL — ABNORMAL LOW (ref >39.00)
LDL Cholesterol: 94 mg/dL (ref 0–99)
NonHDL: 117.32
Total CHOL/HDL Ratio: 4
Triglycerides: 115 mg/dL (ref 0.0–149.0)
VLDL: 23 mg/dL (ref 0.0–40.0)

## 2021-01-25 LAB — HEPATIC FUNCTION PANEL
ALT: 57 U/L — ABNORMAL HIGH (ref 0–35)
AST: 50 U/L — ABNORMAL HIGH (ref 0–37)
Albumin: 4.6 g/dL (ref 3.5–5.2)
Alkaline Phosphatase: 53 U/L (ref 39–117)
Bilirubin, Direct: 0.1 mg/dL (ref 0.0–0.3)
Total Bilirubin: 0.6 mg/dL (ref 0.2–1.2)
Total Protein: 7.4 g/dL (ref 6.0–8.3)

## 2021-01-25 LAB — TSH: TSH: 1.08 u[IU]/mL (ref 0.35–4.50)

## 2021-01-25 NOTE — Patient Instructions (Signed)
Schedule your complete physical in 6 months We'll notify you of your lab results and make any changes if needed Keep up the good work on healthy diet and regular exercise- you're doing great!! Call with any questions or concerns Happy Mother's Day!!

## 2021-01-25 NOTE — Assessment & Plan Note (Signed)
Chronic problem.  Tolerating Lipitor and Gemfibrozil w/o difficulty.  Is now exercising more regularly and trying to lose weight.  Check labs.  Adjust meds prn

## 2021-01-25 NOTE — Progress Notes (Signed)
   Subjective:    Patient ID: Melissa Miles, female    DOB: [birth year], 65 y.o.   MRN: [id number]  HPI Hyperlipidemia- chronic problem, on Lipitor 40mg  daily and Gemfibrozil 600mg  BID.  Denies abd pain, N/V.  Obesity- pt is down 5 lbs since last visit.  BMI is now 30.15  'I feel really good'.  Walking regularly.  Trying to limit carbs. No CP, SOB, HAs, visual changes, edema.   Review of Systems For ROS see HPI   This visit occurred during the SARS-CoV-2 public health emergency.  Safety protocols were in place, including screening questions prior to the visit, additional usage of staff PPE, and extensive cleaning of exam room while observing appropriate contact time as indicated for disinfecting solutions.       Objective:   Physical Exam Vitals reviewed.  Constitutional:      General: She is not in acute distress.    Appearance: Normal appearance. She is well-developed.  HENT:     Head: Normocephalic and atraumatic.  Eyes:     Conjunctiva/sclera: Conjunctivae normal.     Pupils: Pupils are equal, round, and reactive to light.  Neck:     Thyroid: No thyromegaly.  Cardiovascular:     Rate and Rhythm: Normal rate and regular rhythm.     Pulses: Normal pulses.     Heart sounds: Normal heart sounds. No murmur heard.   Pulmonary:     Effort: Pulmonary effort is normal. No respiratory distress.     Breath sounds: Normal breath sounds.  Abdominal:     General: There is no distension.     Palpations: Abdomen is soft.     Tenderness: There is no abdominal tenderness.  Musculoskeletal:     Cervical back: Normal range of motion and neck supple.     Right lower leg: No edema.     Left lower leg: No edema.  Lymphadenopathy:     Cervical: No cervical adenopathy.  Skin:    General: Skin is warm and dry.  Neurological:     Mental Status: She is alert and oriented to person, place, and time.  Psychiatric:        Behavior: Behavior normal.           Assessment & Plan:

## 2021-01-25 NOTE — Assessment & Plan Note (Signed)
Pt is down 5 lbs since last visit.  Applauded her efforts.  Will continue to follow. 

## 2021-01-26 ENCOUNTER — Other Ambulatory Visit: Payer: Self-pay

## 2021-01-26 MED ORDER — DULOXETINE HCL 60 MG PO CPEP: 60.0000 mg | ORAL_CAPSULE | Freq: Every day | ORAL | 0 refills | Status: DC

## 2021-02-06 ENCOUNTER — Other Ambulatory Visit: Payer: Self-pay | Admitting: Family Medicine

## 2021-03-23 DIAGNOSIS — Z01419 Encounter for gynecological examination (general) (routine) without abnormal findings: Secondary | ICD-10-CM | POA: Diagnosis not present

## 2021-03-23 DIAGNOSIS — Z6829 Body mass index (BMI) 29.0-29.9, adult: Secondary | ICD-10-CM | POA: Diagnosis not present

## 2021-03-23 DIAGNOSIS — Z1231 Encounter for screening mammogram for malignant neoplasm of breast: Secondary | ICD-10-CM | POA: Diagnosis not present

## 2021-03-23 DIAGNOSIS — Z13820 Encounter for screening for osteoporosis: Secondary | ICD-10-CM | POA: Diagnosis not present

## 2021-03-23 LAB — HM MAMMOGRAPHY

## 2021-03-23 LAB — HM DEXA SCAN

## 2021-04-04 LAB — HM PAP SMEAR: HPV, high-risk: NEGATIVE

## 2021-05-08 ENCOUNTER — Other Ambulatory Visit: Payer: Self-pay | Admitting: Family Medicine

## 2021-05-08 DIAGNOSIS — E785 Hyperlipidemia, unspecified: Secondary | ICD-10-CM

## 2021-05-12 IMAGING — US US ABDOMEN LIMITED
1 series · 14 of 25 positions shown · non-contrast
Comparison: None.

CLINICAL DATA: 63-year-old female with elevated LFTs. Prior
cholecystectomy.

EXAM:
ULTRASOUND ABDOMEN LIMITED RIGHT UPPER QUADRANT

[Series 1: us abdomen limited · 0.28mm/px · 14 of 28 slices shown]
[im 1/28]
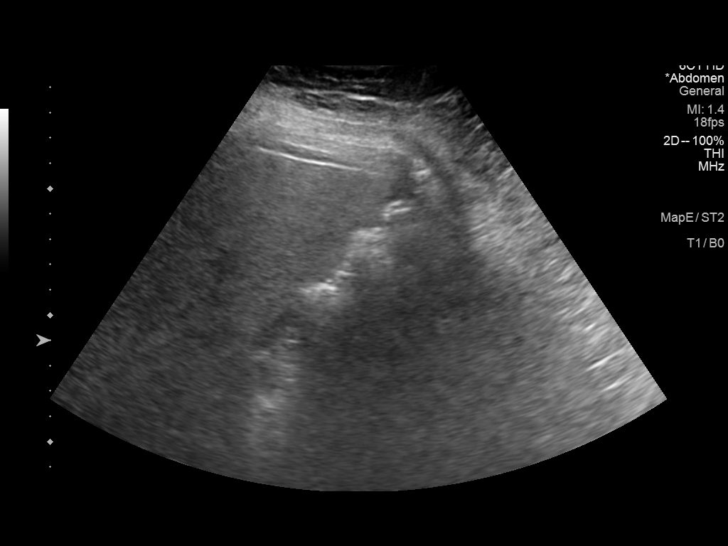
[im 3/28]
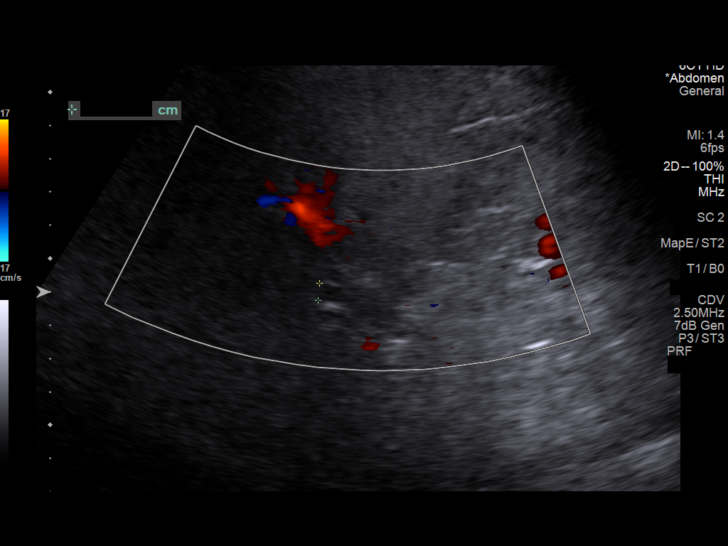
[im 5/28]
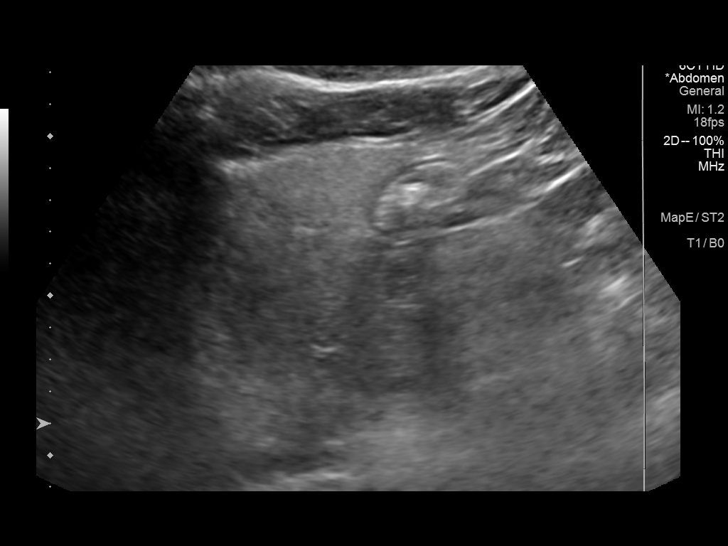
[im 7/28]
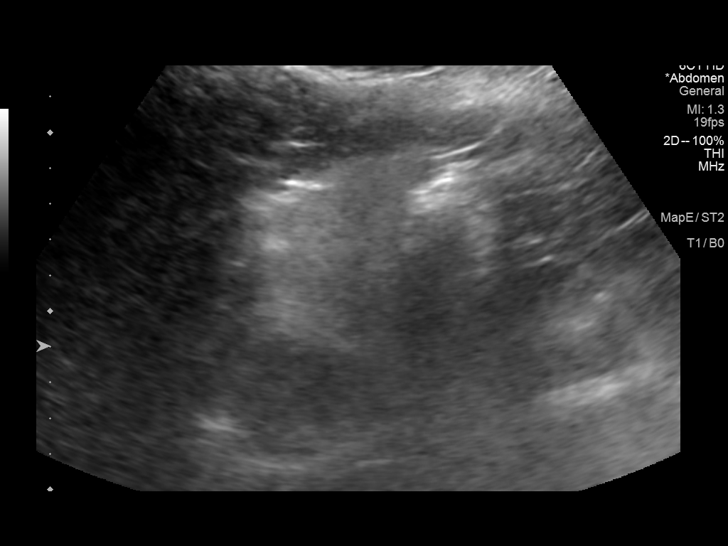
[im 10/28]
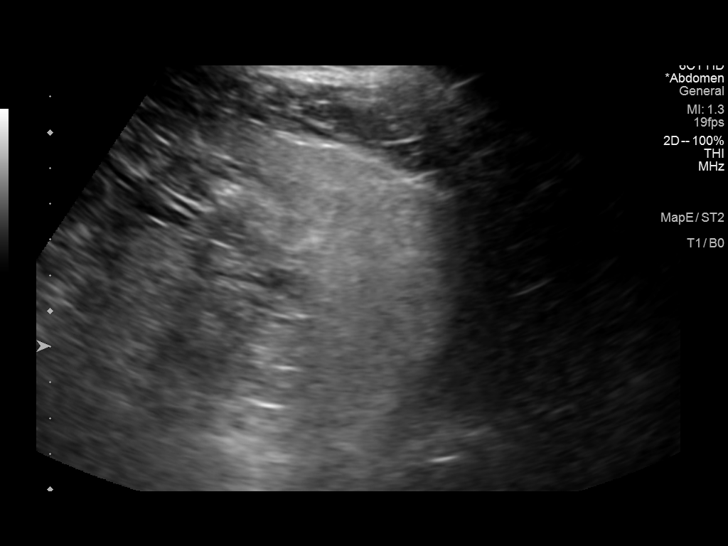
[im 11/28]
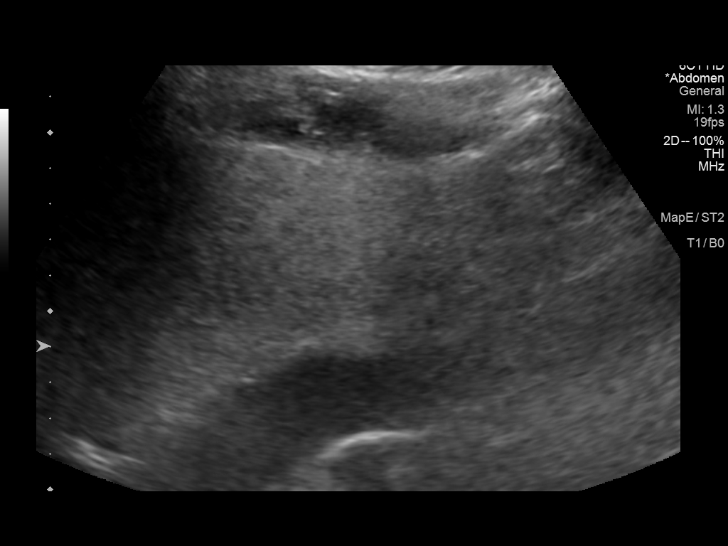
[im 13/28]
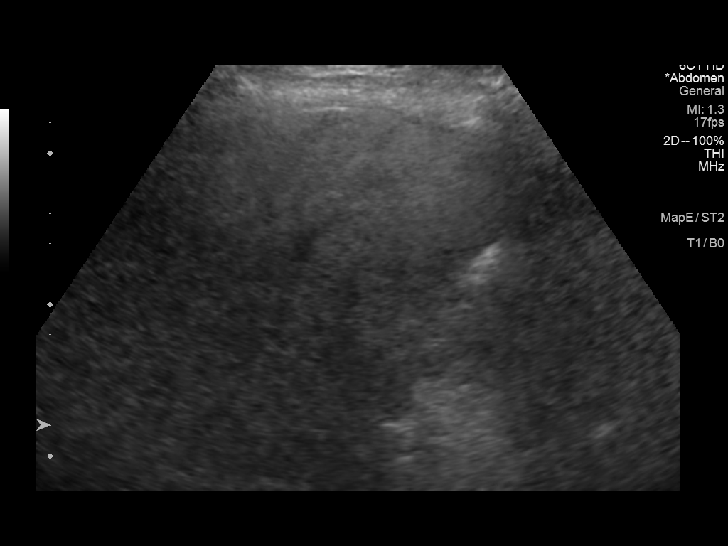
[im 15/28]
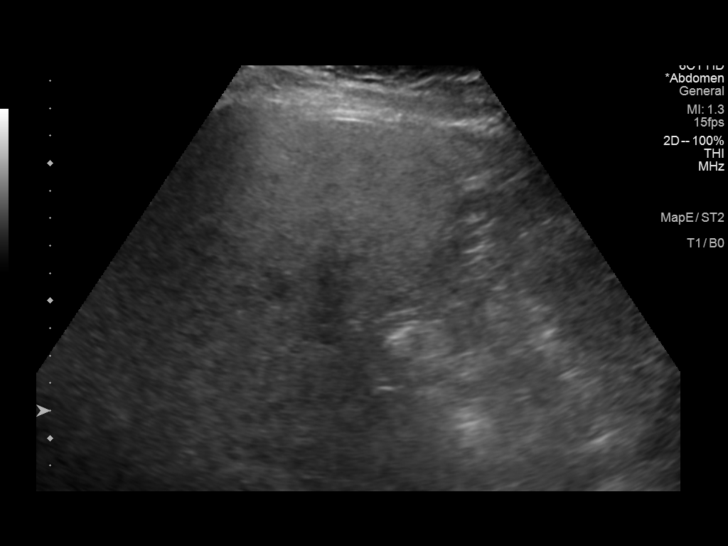
[im 17/28]
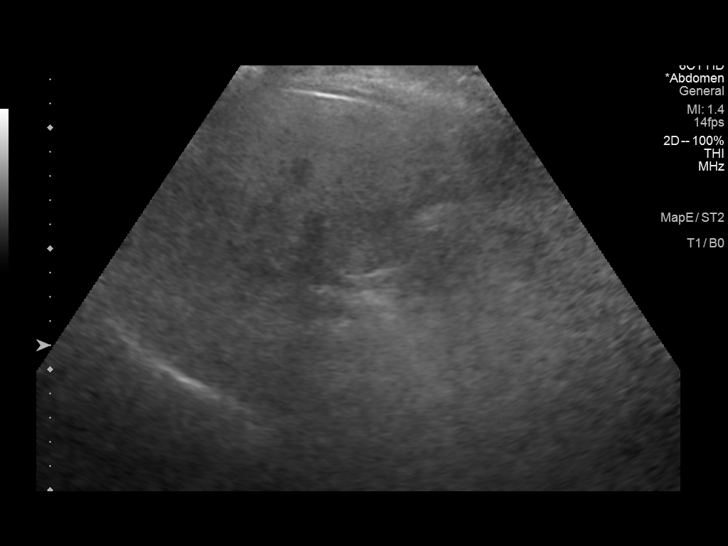
[im 19/28]
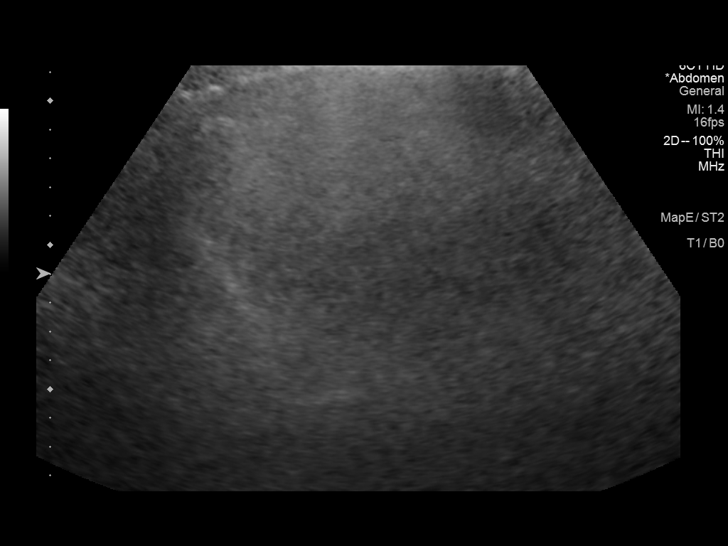
[im 21/28]
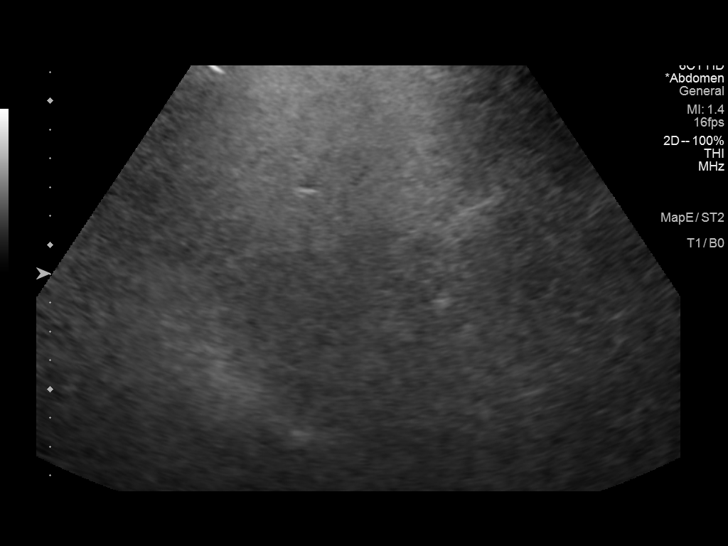
[im 23/28]
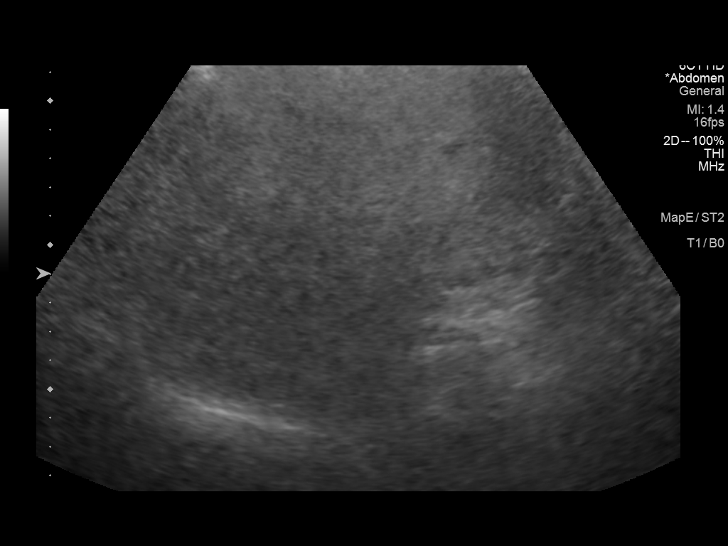
[im 25/28]
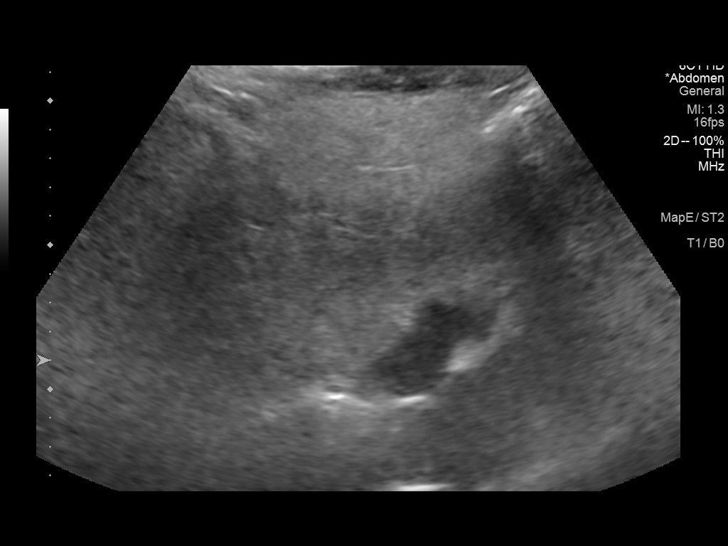
[im 28/28]
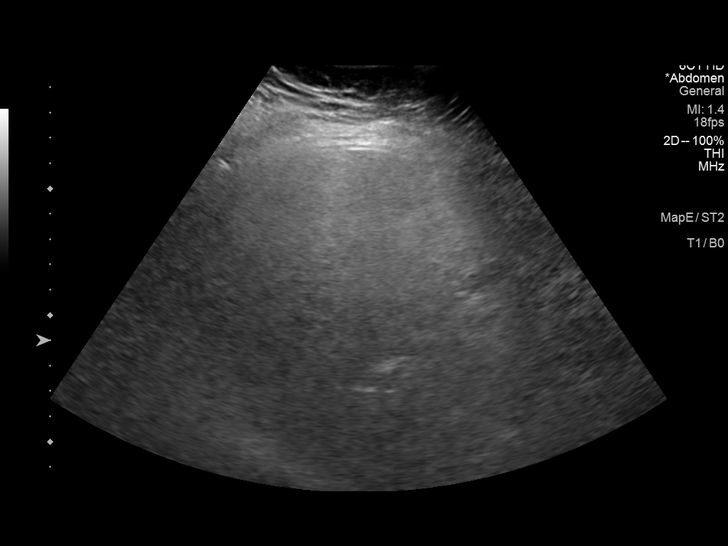

[14 of 25 positions shown; findings below may reference images not displayed]

FINDINGS: Gallbladder:

Surgically absent.

Common bile duct:

Diameter: 5 millimeters, normal.

Liver:

Liver parenchyma appears echogenic (image 13) and difficult to
penetrate. Coarse echotexture. No discrete liver lesion. No
intrahepatic biliary ductal dilatation. Portal vein is patent on
color Doppler imaging with normal direction of blood flow towards
the liver.

Other: No free fluid. Faintly visible right kidney appears negative
on image 13.
IMPRESSION: 1. Coarse liver echogenicity raising the possibility of chronic
hepatocellular disease, and possible hepatic steatosis.
2. No acute findings.  No evidence of bile duct obstruction.

## 2021-07-20 ENCOUNTER — Other Ambulatory Visit: Payer: Self-pay | Admitting: Family Medicine

## 2021-07-25 ENCOUNTER — Other Ambulatory Visit: Payer: Self-pay | Admitting: Family Medicine

## 2021-08-06 ENCOUNTER — Other Ambulatory Visit: Payer: Self-pay | Admitting: Family Medicine

## 2021-08-06 DIAGNOSIS — E785 Hyperlipidemia, unspecified: Secondary | ICD-10-CM

## 2021-08-09 ENCOUNTER — Ambulatory Visit (INDEPENDENT_AMBULATORY_CARE_PROVIDER_SITE_OTHER): Payer: BC Managed Care – PPO | Admitting: Family Medicine

## 2021-08-09 ENCOUNTER — Encounter: Payer: Self-pay | Admitting: Family Medicine

## 2021-08-09 VITALS — BP 132/84 | HR 91 | Temp 97.7°F | Resp 16 | Ht 67.5 in | Wt 196.6 lb

## 2021-08-09 DIAGNOSIS — E210 Primary hyperparathyroidism: Secondary | ICD-10-CM

## 2021-08-09 DIAGNOSIS — Z0000 Encounter for general adult medical examination without abnormal findings: Secondary | ICD-10-CM

## 2021-08-09 DIAGNOSIS — Z23 Encounter for immunization: Secondary | ICD-10-CM

## 2021-08-09 DIAGNOSIS — E785 Hyperlipidemia, unspecified: Secondary | ICD-10-CM

## 2021-08-09 LAB — CBC WITH DIFFERENTIAL/PLATELET
Basophils Absolute: 0 10*3/uL (ref 0.0–0.1)
Basophils Relative: 0.6 % (ref 0.0–3.0)
Eosinophils Absolute: 0.2 10*3/uL (ref 0.0–0.7)
Eosinophils Relative: 3 % (ref 0.0–5.0)
HCT: 42.7 % (ref 36.0–46.0)
Hemoglobin: 14.1 g/dL (ref 12.0–15.0)
Lymphocytes Relative: 37.1 % (ref 12.0–46.0)
Lymphs Abs: 2 10*3/uL (ref 0.7–4.0)
MCHC: 33 g/dL (ref 30.0–36.0)
MCV: 91.6 fl (ref 78.0–100.0)
Monocytes Absolute: 0.4 10*3/uL (ref 0.1–1.0)
Monocytes Relative: 8.2 % (ref 3.0–12.0)
Neutro Abs: 2.7 10*3/uL (ref 1.4–7.7)
Neutrophils Relative %: 51.1 % (ref 43.0–77.0)
Platelets: 239 10*3/uL (ref 150.0–400.0)
RBC: 4.66 Mil/uL (ref 3.87–5.11)
RDW: 13.3 % (ref 11.5–15.5)
WBC: 5.3 10*3/uL (ref 4.0–10.5)

## 2021-08-09 LAB — HEPATIC FUNCTION PANEL
ALT: 90 U/L — ABNORMAL HIGH (ref 0–35)
AST: 88 U/L — ABNORMAL HIGH (ref 0–37)
Albumin: 4.7 g/dL (ref 3.5–5.2)
Alkaline Phosphatase: 71 U/L (ref 39–117)
Bilirubin, Direct: 0.1 mg/dL (ref 0.0–0.3)
Total Bilirubin: 0.7 mg/dL (ref 0.2–1.2)
Total Protein: 7.6 g/dL (ref 6.0–8.3)

## 2021-08-09 LAB — LIPID PANEL
Cholesterol: 153 mg/dL (ref 0–200)
HDL: 38.9 mg/dL — ABNORMAL LOW (ref >39.00)
LDL Cholesterol: 94 mg/dL (ref 0–99)
NonHDL: 114.28
Total CHOL/HDL Ratio: 4
Triglycerides: 99 mg/dL (ref 0.0–149.0)
VLDL: 19.8 mg/dL (ref 0.0–40.0)

## 2021-08-09 LAB — BASIC METABOLIC PANEL
BUN: 24 mg/dL — ABNORMAL HIGH (ref 6–23)
CO2: 23 mEq/L (ref 19–32)
Calcium: 10.8 mg/dL — ABNORMAL HIGH (ref 8.4–10.5)
Chloride: 105 mEq/L (ref 96–112)
Creatinine, Ser: 0.73 mg/dL (ref 0.40–1.20)
GFR: 86.96 mL/min (ref >60.00)
Glucose, Bld: 86 mg/dL (ref 70–99)
Potassium: 4 mEq/L (ref 3.5–5.1)
Sodium: 139 mEq/L (ref 135–145)

## 2021-08-09 LAB — TSH: TSH: 1.32 u[IU]/mL (ref 0.35–5.50)

## 2021-08-09 LAB — VITAMIN D 25 HYDROXY (VIT D DEFICIENCY, FRACTURES): VITD: 52.96 ng/mL (ref 30.00–100.00)

## 2021-08-09 NOTE — Patient Instructions (Signed)
Follow up in 6 months to recheck cholesterol and weight loss progress We'll notify you of your lab results and make any changes if needed Continue to work on healthy diet and regular exercise- you can do it! Call with any questions or concerns Stay Safe!  Stay Healthy! Happy Holidays!!!

## 2021-08-09 NOTE — Assessment & Plan Note (Signed)
Check labs and address prn.

## 2021-08-09 NOTE — Assessment & Plan Note (Signed)
Chronic problem.  Tolerating statin w/o difficulty.  Check labs.  Adjust meds prn  

## 2021-08-09 NOTE — Progress Notes (Signed)
   Subjective:    Patient ID: Melissa Miles, female    DOB: Aug 19, 1956, 65 y.o.   MRN: [id number]  HPI CPE- UTD on mammo, DEXA, colonoscopy, pap.  Due for flu, Tdap  Patient Care Team    Relationship Specialty Notifications Start End  Midge Minium, MD PCP - General Family Medicine  01/06/14   Delila Pereyra, MD Consulting Physician Gynecology  07/20/15   Ladene Artist, MD Consulting Physician Gastroenterology  07/29/19     Health Maintenance  Topic Date Due   Zoster Vaccines- Shingrix (1 of 2) Never done   TETANUS/TDAP  01/25/2020   DEXA SCAN  08/04/2020   MAMMOGRAM  11/26/2020   COVID-19 Vaccine (4 - Booster for Pfizer series) 12/02/2020   INFLUENZA VACCINE  04/24/2021   PAP SMEAR-Modifier  11/27/2022   COLONOSCOPY (Pts 45-93yrs Insurance coverage will need to be confirmed)  03/26/2027   Hepatitis C Screening  Completed   HIV Screening  Completed   Pneumococcal Vaccine 24-63 Years old  Aged Out   HPV VACCINES  Aged Out      Review of Systems Patient reports no vision/ hearing changes, adenopathy,fever, weight change,  persistant/recurrent hoarseness , swallowing issues, chest pain, palpitations, edema, persistant/recurrent cough, hemoptysis, dyspnea (rest/exertional/paroxysmal nocturnal), gastrointestinal bleeding (melena, rectal bleeding), abdominal pain, significant heartburn, bowel changes, GU symptoms (dysuria, hematuria, incontinence), Gyn symptoms (abnormal  bleeding, pain),  syncope, focal weakness, memory loss, numbness & tingling, skin/hair/nail changes, abnormal bruising or bleeding, anxiety, or depression.   This visit occurred during the SARS-CoV-2 public health emergency.  Safety protocols were in place, including screening questions prior to the visit, additional usage of staff PPE, and extensive cleaning of exam room while observing appropriate contact time as indicated for disinfecting solutions.      Objective:   Physical Exam General Appearance:     Alert, cooperative, no distress, appears stated age  Head:    Normocephalic, without obvious abnormality, atraumatic  Eyes:    PERRL, conjunctiva/corneas clear, EOM's intact, fundi    benign, both eyes  Ears:    Normal TM's and external ear canals, both ears  Nose:   Deferred due to COVID  Throat:   Neck:   Supple, symmetrical, trachea midline, no adenopathy;    Thyroid: no enlargement/tenderness/nodules  Back:     Symmetric, no curvature, ROM normal, no CVA tenderness  Lungs:     Clear to auscultation bilaterally, respirations unlabored  Chest Wall:    No tenderness or deformity   Heart:    Regular rate and rhythm, S1 and S2 normal, no murmur, rub   or gallop  Breast Exam:    Deferred to GYN  Abdomen:     Soft, non-tender, bowel sounds active all four quadrants,    no masses, no organomegaly  Genitalia:    Deferred to GYN  Rectal:    Extremities:   Extremities normal, atraumatic, no cyanosis or edema  Pulses:   2+ and symmetric all extremities  Skin:   Skin color, texture, turgor normal, no rashes or lesions  Lymph nodes:   Cervical, supraclavicular, and axillary nodes normal  Neurologic:   CNII-XII intact, normal strength, sensation and reflexes    throughout          Assessment & Plan:

## 2021-08-09 NOTE — Assessment & Plan Note (Signed)
Pt's PE WNL w/ exception of BMI.  UTD on pap, mammo, DEXA, colonoscopy.  Flu and Tdap given today.  Check labs.  Anticipatory guidance provided.

## 2021-08-10 ENCOUNTER — Encounter: Payer: Self-pay | Admitting: Family Medicine

## 2021-08-10 LAB — PARATHYROID HORMONE, INTACT (NO CA): PTH: 93 pg/mL — ABNORMAL HIGH (ref 16–77)

## 2021-08-24 ENCOUNTER — Other Ambulatory Visit: Payer: Self-pay

## 2021-08-24 ENCOUNTER — Telehealth: Payer: Self-pay | Admitting: Family Medicine

## 2021-08-24 DIAGNOSIS — E210 Primary hyperparathyroidism: Secondary | ICD-10-CM

## 2021-08-24 DIAGNOSIS — R319 Hematuria, unspecified: Secondary | ICD-10-CM | POA: Diagnosis not present

## 2021-08-24 NOTE — Telephone Encounter (Signed)
..  Caller name:   [name]  On DPR? :yes/no: Yes  Call back number:[phone]  Provider they see: Birdie Riddle  Reason for call: Patient is bleeding when she urinates.  She said that it seems to be right much everytime she urinates.

## 2021-08-24 NOTE — Telephone Encounter (Signed)
Pt needs to be scheduled for an appt (today if possible) for evaluation- either at this office or another location.

## 2021-08-25 ENCOUNTER — Other Ambulatory Visit (INDEPENDENT_AMBULATORY_CARE_PROVIDER_SITE_OTHER): Payer: BC Managed Care – PPO

## 2021-08-25 ENCOUNTER — Other Ambulatory Visit: Payer: Self-pay | Admitting: Family Medicine

## 2021-08-25 DIAGNOSIS — R7989 Other specified abnormal findings of blood chemistry: Secondary | ICD-10-CM | POA: Diagnosis not present

## 2021-08-25 LAB — HEPATIC FUNCTION PANEL
ALT: 85 U/L — ABNORMAL HIGH (ref 0–35)
AST: 75 U/L — ABNORMAL HIGH (ref 0–37)
Albumin: 4.4 g/dL (ref 3.5–5.2)
Alkaline Phosphatase: 63 U/L (ref 39–117)
Bilirubin, Direct: 0.1 mg/dL (ref 0.0–0.3)
Total Bilirubin: 0.5 mg/dL (ref 0.2–1.2)
Total Protein: 7.6 g/dL (ref 6.0–8.3)

## 2021-08-29 ENCOUNTER — Telehealth: Payer: Self-pay

## 2021-08-29 NOTE — Telephone Encounter (Signed)
-----   Message from Midge Minium, MD sent at 08/25/2021 12:39 PM EST ----- Liver functions are trending in the right direction.  Continue to work on low carb diet, regular exercise, and avoid tylenol and alcohol as much as possible

## 2021-08-29 NOTE — Telephone Encounter (Signed)
Spoke to patient she is aware of her labs

## 2021-08-30 NOTE — Telephone Encounter (Signed)
Please let pt know that I am deeply sorry that this was not addressed in a timely or appropriate manner.  We will certainly look into the process failures so this does not happen again.

## 2021-08-30 NOTE — Telephone Encounter (Signed)
When I called pt to give her an update on the referral for ENDO., she let me know that she didn't receive a phone call back about this. She states that this has never happened to her before from our office and it was very upsetting to her. She states that she did call her GYN and was able to get seen there.   She wanted to bring it to Dr. Virgil Benedict attention.

## 2021-09-11 ENCOUNTER — Encounter: Payer: Self-pay | Admitting: *Deleted

## 2021-09-11 NOTE — Telephone Encounter (Signed)
Called patient and left voicemail with an apology from our behalf for the service she received. I encouraged her to call us back if she had further questions or concerns for me.

## 2021-09-21 DIAGNOSIS — E210 Primary hyperparathyroidism: Secondary | ICD-10-CM | POA: Diagnosis not present

## 2021-09-25 DIAGNOSIS — E210 Primary hyperparathyroidism: Secondary | ICD-10-CM | POA: Diagnosis not present

## 2021-10-06 DIAGNOSIS — R310 Gross hematuria: Secondary | ICD-10-CM | POA: Diagnosis not present

## 2021-10-09 ENCOUNTER — Other Ambulatory Visit (HOSPITAL_COMMUNITY): Payer: Self-pay | Admitting: Endocrinology

## 2021-10-09 ENCOUNTER — Other Ambulatory Visit: Payer: Self-pay | Admitting: Endocrinology

## 2021-10-09 DIAGNOSIS — E210 Primary hyperparathyroidism: Secondary | ICD-10-CM

## 2021-10-18 ENCOUNTER — Other Ambulatory Visit: Payer: Self-pay

## 2021-10-18 ENCOUNTER — Encounter (HOSPITAL_COMMUNITY): Payer: Self-pay

## 2021-10-18 ENCOUNTER — Encounter (HOSPITAL_COMMUNITY): Admission: RE | Admit: 2021-10-18 | Payer: BC Managed Care – PPO | Source: Ambulatory Visit

## 2021-10-18 ENCOUNTER — Encounter (HOSPITAL_COMMUNITY): Payer: BC Managed Care – PPO | Attending: Endocrinology

## 2021-10-18 ENCOUNTER — Ambulatory Visit (HOSPITAL_COMMUNITY)
Admission: EM | Admit: 2021-10-18 | Discharge: 2021-10-18 | Disposition: A | Discharge status: Home / Self Care | Payer: BC Managed Care – PPO | Attending: Emergency Medicine | Admitting: Emergency Medicine

## 2021-10-18 ENCOUNTER — Encounter (HOSPITAL_COMMUNITY): Payer: Self-pay | Admitting: Emergency Medicine

## 2021-10-18 DIAGNOSIS — N200 Calculus of kidney: Secondary | ICD-10-CM

## 2021-10-18 LAB — POCT URINALYSIS DIPSTICK, ED / UC
Bilirubin Urine: NEGATIVE
Glucose, UA: NEGATIVE mg/dL
Leukocytes,Ua: NEGATIVE
Nitrite: NEGATIVE
Protein, ur: NEGATIVE mg/dL
Specific Gravity, Urine: 1.025 (ref 1.005–1.030)
Urobilinogen, UA: 0.2 mg/dL (ref 0.0–1.0)
pH: 5.5 (ref 5.0–8.0)

## 2021-10-18 MED ORDER — OXYCODONE-ACETAMINOPHEN 5-325 MG PO TABS
1.0000 | ORAL_TABLET | ORAL | 0 refills | Status: DC | PRN
Start: 2021-10-18 — End: 2022-02-07

## 2021-10-18 MED ORDER — ONDANSETRON HCL 4 MG PO TABS: 4.0000 mg | ORAL_TABLET | Freq: Three times a day (TID) | ORAL | 0 refills | Status: DC | PRN

## 2021-10-18 MED ORDER — TAMSULOSIN HCL 0.4 MG PO CAPS: 0.4000 mg | ORAL_CAPSULE | Freq: Every day | ORAL | 0 refills | Status: DC

## 2021-10-18 NOTE — ED Provider Notes (Signed)
[place]    CSN: [id number] Arrival date & time: 10/18/21  1241      History   Chief Complaint Chief Complaint  Patient presents with   Back Pain    HPI Melissa Miles is a 66 y.o. female.  She reports she is undergoing work-up for kidney stones.  A cystoscopy and CT scan scheduled for early next month.  Today developed left flank pain and nausea.  No vomiting.  Denies fever or abdominal pain.  Is worried that she is passing a kidney stone.   Back Pain Associated symptoms: no abdominal pain, no dysuria and no fever    Past Medical History:  Diagnosis Date   Anxiety    Hyperlipidemia    IBS (irritable bowel syndrome)    Dr Fuller Plan   Tubular adenoma of colon 03/2020    Patient Active Problem List   Diagnosis Date Noted   Primary hyperparathyroidism ([place]) 01/25/2021   Hypercalcemia 02/14/2017   Physical exam 07/09/2014   Osteopenia 01/06/2014   Obesity (BMI 30-39.9) 01/06/2014   Hyperlipidemia 08/08/2010   Anxiety state 01/24/2010   POSTMENOPAUSAL SYNDROME 01/24/2010   PAP SMEAR, ABNORMAL 01/24/2010   IRRITABLE BOWEL SYNDROME, HX OF 01/24/2010    Past Surgical History:  Procedure Laterality Date   CERVICAL BIOPSY     X2 ; PMH abnormal PAP   CHOLECYSTECTOMY     CHOLECYSTECTOMY, LAPAROSCOPIC  2002   COLONOSCOPY      negative X 3   TUMOR REMOVAL     on buttocks    OB History   No obstetric history on file.      Home Medications    Prior to Admission medications   Medication Sig Start Date End Date Taking? Authorizing Provider  ondansetron (ZOFRAN) 4 MG tablet Take 1 tablet (4 mg total) by mouth every 8 (eight) hours as needed for nausea or vomiting. 10/18/21  Yes Carvel Getting, NP  oxyCODONE-acetaminophen (PERCOCET/ROXICET) 5-325 MG tablet Take 1 tablet by mouth every 4 (four) hours as needed for severe pain. 10/18/21  Yes Carvel Getting, NP  tamsulosin (FLOMAX) 0.4 MG CAPS capsule Take 1 capsule (0.4 mg total) by mouth daily. 10/18/21   Yes Carvel Getting, NP  aspirin 81 MG tablet Take 81 mg by mouth daily.    [provider]  atorvastatin (LIPITOR) 40 MG tablet Take 1 tablet by mouth once daily 08/07/21   Midge Minium, MD  DULoxetine (CYMBALTA) 60 MG capsule Take 1 capsule (60 mg total) by mouth daily. 01/26/21   Midge Minium, MD  DULoxetine (CYMBALTA) 60 MG capsule Take 1 capsule by mouth once daily 08/07/21   Midge Minium, MD  gemfibrozil (LOPID) 600 MG tablet TAKE 1 TABLET BY MOUTH TWICE DAILY BEFORE MEAL(S) 08/07/21   Midge Minium, MD  Multiple Vitamin (MULTIVITAMIN) tablet Take 1 tablet by mouth daily.    [provider]    Family History Family History  Problem Relation Age of Onset   Mental illness Other        half sister had  ? bipolar   Heart attack Other 26       half bro    Cancer Father        NHL   Osteoporosis Mother    Thyroid disease Other        half bro , hypothyroidism   Hypercalcemia Neg Hx    Colon cancer Neg Hx    Esophageal cancer Neg Hx  Rectal cancer Neg Hx    Stomach cancer Neg Hx     Social History Social History   Tobacco Use   Smoking status: Never   Smokeless tobacco: Never  Vaping Use   Vaping Use: Never used  Substance Use Topics   Alcohol use: Yes    Comment: [place]- on beach   Drug use: No     Allergies   Patient has no known allergies.   Review of Systems Review of Systems  Constitutional:  Negative for chills and fever.  Gastrointestinal:  Positive for nausea. Negative for abdominal pain and vomiting.  Genitourinary:  Positive for flank pain. Negative for dysuria and hematuria.  Musculoskeletal:  Positive for back pain.    Physical Exam Triage Vital Signs ED Triage Vitals  Enc Vitals Group     BP 10/18/21 1346 (!) 174/89     Pulse Rate 10/18/21 1346 (!) 104     Resp 10/18/21 1346 18     Temp 10/18/21 1346 98.4 F (36.9 C)     Temp Source 10/18/21 1346 Oral     SpO2 10/18/21 1346 100 %     Weight --       Height --      Head Circumference --      Peak Flow --      Pain Score 10/18/21 1345 7     Pain Loc --      Pain Edu? --      Excl. in [place]? --    No data found.  Updated Vital Signs BP (!) 174/89 (BP Location: Left Arm)    Pulse (!) 104    Temp 98.4 F (36.9 C) (Oral)    Resp 18    SpO2 100%   Visual Acuity Right Eye Distance:   Left Eye Distance:   Bilateral Distance:    Right Eye Near:   Left Eye Near:    Bilateral Near:     Physical Exam Constitutional:      Appearance: Normal appearance.     Comments: Appears uncomfortable  Cardiovascular:     Rate and Rhythm: Regular rhythm. Tachycardia present.  Pulmonary:     Effort: Pulmonary effort is normal.     Breath sounds: Normal breath sounds.  Abdominal:     General: Abdomen is flat. There is no distension.     Palpations: Abdomen is soft.     Tenderness: There is no abdominal tenderness. There is left CVA tenderness. There is no right CVA tenderness, guarding or rebound.  Neurological:     Mental Status: She is alert.     UC Treatments / Results  Labs (all labs ordered are listed, but only abnormal results are displayed) Labs Reviewed  POCT URINALYSIS DIPSTICK, ED / UC - Abnormal; Notable for the following components:      Result Value   Ketones, ur TRACE (*)    Hgb urine dipstick LARGE (*)    All other components within normal limits    EKG   Radiology No results found.  Procedures Procedures (including critical care time)  Medications Ordered in UC Medications - No data to display  Initial Impression / Assessment and Plan / UC Course  I have reviewed the triage vital signs and the nursing notes.  Pertinent labs & imaging results that were available during my care of the patient were reviewed by me and considered in my medical decision making (see chart for details).    Large hemoglobin on UA.  Given patient's  history and symptoms, I suspect she has a kidney stone.  Prescribed oxycodone  acetaminophen, Zofran, Flomax.  Patient asked that I route this note to her urologist Dr. Claudia Desanctis.  Patient advised to follow-up with urologist and let them know today she likely has an active kidney stone.  Final Clinical Impressions(s) / UC Diagnoses   Final diagnoses:  Nephrolithiasis     Discharge Instructions      Please let Dr. Claudia Desanctis know you likely have an actively passing kidney stone and see when he needs to see you for follow up.     ED Prescriptions     Medication Sig Dispense Auth. Provider   oxyCODONE-acetaminophen (PERCOCET/ROXICET) 5-325 MG tablet Take 1 tablet by mouth every 4 (four) hours as needed for severe pain. 16 tablet Carvel Getting, NP   ondansetron (ZOFRAN) 4 MG tablet Take 1 tablet (4 mg total) by mouth every 8 (eight) hours as needed for nausea or vomiting. 20 tablet Carvel Getting, NP   tamsulosin (FLOMAX) 0.4 MG CAPS capsule Take 1 capsule (0.4 mg total) by mouth daily. 30 capsule Carvel Getting, NP      I have reviewed the PDMP during this encounter.   Carvel Getting, NP 10/18/21 1446

## 2021-10-18 NOTE — Discharge Instructions (Addendum)
Please let Dr. Claudia Desanctis know you likely have an actively passing kidney stone and see when he needs to see you for follow up.

## 2021-10-18 NOTE — ED Triage Notes (Signed)
This morning felt bloated. Pt reports that at work started having left flank pain caused nausea. Reports in process of having testing done on kidneys for possible stones.  Denies any urinary problems today.

## 2021-10-23 DIAGNOSIS — R310 Gross hematuria: Secondary | ICD-10-CM | POA: Diagnosis not present

## 2021-10-27 DIAGNOSIS — R310 Gross hematuria: Secondary | ICD-10-CM | POA: Diagnosis not present

## 2021-11-03 DIAGNOSIS — R310 Gross hematuria: Secondary | ICD-10-CM | POA: Diagnosis not present

## 2021-11-03 DIAGNOSIS — N201 Calculus of ureter: Secondary | ICD-10-CM | POA: Diagnosis not present

## 2021-11-04 ENCOUNTER — Other Ambulatory Visit: Payer: Self-pay | Admitting: Family Medicine

## 2021-11-04 DIAGNOSIS — E785 Hyperlipidemia, unspecified: Secondary | ICD-10-CM

## 2021-11-07 ENCOUNTER — Other Ambulatory Visit: Payer: Self-pay | Admitting: Urology

## 2021-11-08 NOTE — Progress Notes (Signed)
Left message for patient to return call to [phone].

## 2021-11-08 NOTE — Progress Notes (Signed)
11/08/2021' 11:38 AM Pre procedure call completed. Pt. Updated on date and time of arrival 11/13/21 at 0645. Times for NPO at [facility] and clear liquid consumption until 0445 reviewed. Pt. Allergies, medical hx and medication list reviewed. Medications ok to take day of surgery and those to hold prior to surgery reviewed (stop ASA and MV Friday). Ride secured for day of surgery. (Husband) Questions and concerns addressed by patient. Pt. Verbalized understanding of all instructions.  Melissa Miles, Arville Lime

## 2021-11-13 ENCOUNTER — Encounter (HOSPITAL_BASED_OUTPATIENT_CLINIC_OR_DEPARTMENT_OTHER): Payer: Self-pay | Admitting: Urology

## 2021-11-13 ENCOUNTER — Ambulatory Visit (HOSPITAL_BASED_OUTPATIENT_CLINIC_OR_DEPARTMENT_OTHER)
Admission: RE | Admit: 2021-11-13 | Discharge: 2021-11-13 | Disposition: A | Discharge status: Home / Self Care | Payer: BC Managed Care – PPO | Attending: Urology | Admitting: Urology

## 2021-11-13 ENCOUNTER — Ambulatory Visit (HOSPITAL_COMMUNITY): Payer: BC Managed Care – PPO

## 2021-11-13 ENCOUNTER — Encounter (HOSPITAL_BASED_OUTPATIENT_CLINIC_OR_DEPARTMENT_OTHER)
Admission: RE | Disposition: A | Discharge status: Home / Self Care | Payer: Self-pay | Source: Home / Self Care | Attending: Urology

## 2021-11-13 ENCOUNTER — Other Ambulatory Visit: Payer: Self-pay

## 2021-11-13 DIAGNOSIS — Z79899 Other long term (current) drug therapy: Secondary | ICD-10-CM | POA: Diagnosis not present

## 2021-11-13 DIAGNOSIS — N201 Calculus of ureter: Secondary | ICD-10-CM | POA: Insufficient documentation

## 2021-11-13 DIAGNOSIS — Z9049 Acquired absence of other specified parts of digestive tract: Secondary | ICD-10-CM | POA: Diagnosis not present

## 2021-11-13 DIAGNOSIS — I878 Other specified disorders of veins: Secondary | ICD-10-CM | POA: Diagnosis not present

## 2021-11-13 DIAGNOSIS — Z01818 Encounter for other preprocedural examination: Secondary | ICD-10-CM | POA: Diagnosis not present

## 2021-11-13 HISTORY — PX: EXTRACORPOREAL SHOCK WAVE LITHOTRIPSY: SHX1557

## 2021-11-13 SURGERY — LITHOTRIPSY, ESWL
Anesthesia: LOCAL | Laterality: Left

## 2021-11-13 MED ORDER — DIAZEPAM 5 MG PO TABS
10.0000 mg | ORAL_TABLET | ORAL | Status: AC
  Administered 2021-11-13: 10 mg via ORAL

## 2021-11-13 MED ORDER — DIPHENHYDRAMINE HCL 25 MG PO CAPS
25.0000 mg | ORAL_CAPSULE | ORAL | Status: AC
  Administered 2021-11-13: 25 mg via ORAL

## 2021-11-13 MED ORDER — DIAZEPAM 5 MG PO TABS
ORAL_TABLET | ORAL | Status: AC
  Filled 2021-11-13: qty 2

## 2021-11-13 MED ORDER — SODIUM CHLORIDE 0.9 % IV SOLN: INTRAVENOUS | Status: DC

## 2021-11-13 MED ORDER — CIPROFLOXACIN HCL 500 MG PO TABS
ORAL_TABLET | ORAL | Status: AC
  Filled 2021-11-13: qty 1

## 2021-11-13 MED ORDER — CIPROFLOXACIN HCL 500 MG PO TABS
500.0000 mg | ORAL_TABLET | ORAL | Status: AC
  Administered 2021-11-13: 500 mg via ORAL

## 2021-11-13 MED ORDER — DIPHENHYDRAMINE HCL 25 MG PO CAPS
ORAL_CAPSULE | ORAL | Status: AC
  Filled 2021-11-13: qty 1

## 2021-11-13 NOTE — Brief Op Note (Signed)
11/13/2021  9:52 AM  PATIENT:  Melissa Miles  66 y.o. female  PRE-OPERATIVE DIAGNOSIS:  URETERAL CALCULUS  POST-OPERATIVE DIAGNOSIS:  * No post-op diagnosis entered *  PROCEDURE:  Procedure(s) with comments: EXTRACORPOREAL SHOCK WAVE LITHOTRIPSY (ESWL) (Left) - 75 MINS  SURGEON:  Surgeon(s) and Role:    * Alexis Frock, MD - Primary  PHYSICIAN ASSISTANT:   ASSISTANTS: none   ANESTHESIA:   MAC  EBL:  none   BLOOD ADMINISTERED:none  DRAINS: none   LOCAL MEDICATIONS USED:  NONE  SPECIMEN:  No Specimen  DISPOSITION OF SPECIMEN:  N/A  COUNTS:  YES  TOURNIQUET:  * No tourniquets in log *  DICTATION: .Note written in paper chart  PLAN OF CARE: Discharge to home after PACU  PATIENT DISPOSITION:  Short Stay   Delay start of Pharmacological VTE agent (>24hrs) due to surgical blood loss or risk of bleeding: not applicable

## 2021-11-13 NOTE — Discharge Instructions (Addendum)
1 - You may have urinary urgency (bladder spasms), pass small stone fragemnts, and bloody urine on / off for up to 2 weeks. This is normal.  2 - Call MD or go to ER for fever >102, severe pain / nausea / vomiting not relieved by medications, or acute change in medical status  Post Anesthesia Home Care Instructions  Activity: Get plenty of rest for the remainder of the day. A responsible adult should stay with you for 24 hours following the procedure.  For the next 24 hours, DO NOT: -Drive a car -Paediatric nurse -Drink alcoholic beverages -Take any medication unless instructed by your physician -Make any legal decisions or sign important papers.  Meals: Start with liquid foods such as gelatin or soup. Progress to regular foods as tolerated. Avoid greasy, spicy, heavy foods. If nausea and/or vomiting occur, drink only clear liquids until the nausea and/or vomiting subsides. Call your physician if vomiting continues.  Special Instructions/Symptoms: Your throat may feel dry or sore from the anesthesia or the breathing tube placed in your throat during surgery. If this causes discomfort, gargle with warm salt water. The discomfort should disappear within 24 hours.  If you had a scopolamine patch placed behind your ear for the management of post- operative nausea and/or vomiting:  1. The medication in the patch is effective for 72 hours, after which it should be removed.  Wrap patch in a tissue and discard in the trash. Wash hands thoroughly with soap and water. 2. You may remove the patch earlier than 72 hours if you experience unpleasant side effects which may include dry mouth, dizziness or visual disturbances. 3. Avoid touching the patch. Wash your hands with soap and water after contact with the patch.

## 2021-11-13 NOTE — H&P (Signed)
Melissa Miles is an 66 y.o. female.    Chief Complaint: Pre-Op LEFT Shockwave Lithotripsy  HPI:   1 Small Left Ureteral Stone - 32mm left prox  stone by hematuria CT 10/2021 on eval hematuria. UA without infectious paremeters.  Today "Melissa Miles" is seen for left shockwave lithotripsy for small left stone. Stone at L2-L3 interspace by KUB and solitary.   Past Medical History:  Diagnosis Date   Anxiety    Hyperlipidemia    IBS (irritable bowel syndrome)    Dr Fuller Plan   Tubular adenoma of colon 03/2020    Past Surgical History:  Procedure Laterality Date   CERVICAL BIOPSY     X2 ; PMH abnormal PAP   CHOLECYSTECTOMY     CHOLECYSTECTOMY, LAPAROSCOPIC  2002   COLONOSCOPY      negative X 3   TUMOR REMOVAL     on buttocks    Family History  Problem Relation Age of Onset   Mental illness Other        half sister had  ? bipolar   Heart attack Other 37       half bro    Cancer Father        NHL   Osteoporosis Mother    Thyroid disease Other        half bro , hypothyroidism   Hypercalcemia Neg Hx    Colon cancer Neg Hx    Esophageal cancer Neg Hx    Rectal cancer Neg Hx    Stomach cancer Neg Hx    Social History:  reports that she has never smoked. She has never used smokeless tobacco. She reports current alcohol use. She reports that she does not use drugs.  Allergies: No Known Allergies  Medications Prior to Admission  Medication Sig Dispense Refill   aspirin 81 MG tablet Take 81 mg by mouth daily.     atorvastatin (LIPITOR) 40 MG tablet Take 1 tablet by mouth once daily 90 tablet 0   DULoxetine (CYMBALTA) 60 MG capsule Take 1 capsule (60 mg total) by mouth daily. 90 capsule 0   DULoxetine (CYMBALTA) 60 MG capsule Take 1 capsule by mouth once daily 90 capsule 0   gemfibrozil (LOPID) 600 MG tablet TAKE 1 TABLET BY MOUTH TWICE DAILY BEFORE MEAL(S) 180 tablet 0   Multiple Vitamin (MULTIVITAMIN) tablet Take 1 tablet by mouth daily.     ondansetron (ZOFRAN) 4 MG tablet Take  1 tablet (4 mg total) by mouth every 8 (eight) hours as needed for nausea or vomiting. 20 tablet 0   oxyCODONE-acetaminophen (PERCOCET/ROXICET) 5-325 MG tablet Take 1 tablet by mouth every 4 (four) hours as needed for severe pain. 16 tablet 0   tamsulosin (FLOMAX) 0.4 MG CAPS capsule Take 1 capsule (0.4 mg total) by mouth daily. 30 capsule 0    No results found for this or any previous visit (from the past 48 hour(s)). No results found.  Review of Systems  Constitutional:  Negative for chills and fever.  Genitourinary:  Positive for flank pain and hematuria.  All other systems reviewed and are negative.  There were no vitals taken for this visit. Physical Exam Vitals reviewed.  HENT:     Head: Normocephalic.     Mouth/Throat:     Mouth: Mucous membranes are moist.  Eyes:     Pupils: Pupils are equal, round, and reactive to light.  Cardiovascular:     Rate and Rhythm: Normal rate.  Pulmonary:     Effort: Pulmonary  effort is normal.  Abdominal:     Comments: Moderate truncal obesity  Genitourinary:    Comments: Mild left CVAT at present Musculoskeletal:     Cervical back: Normal range of motion.  Skin:    General: Skin is warm.  Neurological:     General: No focal deficit present.  Psychiatric:        Mood and Affect: Mood normal.     Assessment/Plan  Proceed as planned with LEFT shockwave lithotripsy. Risks, benefits, alternatives, expected peri-op course discussed.   Alexis Frock, MD 11/13/2021, 7:13 AM

## 2021-11-14 ENCOUNTER — Encounter (HOSPITAL_BASED_OUTPATIENT_CLINIC_OR_DEPARTMENT_OTHER): Payer: Self-pay | Admitting: Urology

## 2021-12-01 DIAGNOSIS — N201 Calculus of ureter: Secondary | ICD-10-CM | POA: Diagnosis not present

## 2022-01-02 ENCOUNTER — Encounter (HOSPITAL_COMMUNITY)
Admission: RE | Admit: 2022-01-02 | Discharge: 2022-01-02 | Disposition: A | Discharge status: Home / Self Care | Payer: BC Managed Care – PPO | Source: Ambulatory Visit | Attending: Endocrinology | Admitting: Endocrinology

## 2022-01-02 ENCOUNTER — Ambulatory Visit (HOSPITAL_COMMUNITY)
Admission: RE | Admit: 2022-01-02 | Discharge: 2022-01-02 | Disposition: A | Discharge status: Home / Self Care | Payer: BC Managed Care – PPO | Source: Ambulatory Visit | Attending: Endocrinology | Admitting: Endocrinology

## 2022-01-02 DIAGNOSIS — E210 Primary hyperparathyroidism: Secondary | ICD-10-CM | POA: Insufficient documentation

## 2022-01-02 DIAGNOSIS — E213 Hyperparathyroidism, unspecified: Secondary | ICD-10-CM | POA: Diagnosis not present

## 2022-01-02 MED ORDER — TECHNETIUM TC 99M SESTAMIBI - CARDIOLITE
25.2000 | Freq: Once | INTRAVENOUS | Status: AC | PRN
  Administered 2022-01-02: 25.2 via INTRAVENOUS

## 2022-01-11 DIAGNOSIS — E210 Primary hyperparathyroidism: Secondary | ICD-10-CM | POA: Diagnosis not present

## 2022-01-18 DIAGNOSIS — M8580 Other specified disorders of bone density and structure, unspecified site: Secondary | ICD-10-CM | POA: Diagnosis not present

## 2022-01-18 DIAGNOSIS — E210 Primary hyperparathyroidism: Secondary | ICD-10-CM | POA: Diagnosis not present

## 2022-02-07 ENCOUNTER — Ambulatory Visit: Payer: BC Managed Care – PPO | Admitting: Family Medicine

## 2022-02-07 ENCOUNTER — Encounter: Payer: Self-pay | Admitting: Family Medicine

## 2022-02-07 VITALS — BP 128/80 | HR 85 | Temp 98.9°F | Resp 16 | Ht 68.0 in | Wt 194.8 lb

## 2022-02-07 DIAGNOSIS — E785 Hyperlipidemia, unspecified: Secondary | ICD-10-CM | POA: Diagnosis not present

## 2022-02-07 DIAGNOSIS — E210 Primary hyperparathyroidism: Secondary | ICD-10-CM | POA: Diagnosis not present

## 2022-02-07 DIAGNOSIS — E663 Overweight: Secondary | ICD-10-CM | POA: Diagnosis not present

## 2022-02-07 LAB — LIPID PANEL
Cholesterol: 153 mg/dL (ref 0–200)
HDL: 38.2 mg/dL — ABNORMAL LOW (ref >39.00)
LDL Cholesterol: 92 mg/dL (ref 0–99)
NonHDL: 115
Total CHOL/HDL Ratio: 4
Triglycerides: 117 mg/dL (ref 0.0–149.0)
VLDL: 23.4 mg/dL (ref 0.0–40.0)

## 2022-02-07 LAB — CBC WITH DIFFERENTIAL/PLATELET
Basophils Absolute: 0 10*3/uL (ref 0.0–0.1)
Basophils Relative: 0.7 % (ref 0.0–3.0)
Eosinophils Absolute: 0.3 10*3/uL (ref 0.0–0.7)
Eosinophils Relative: 4.5 % (ref 0.0–5.0)
HCT: 41.7 % (ref 36.0–46.0)
Hemoglobin: 14 g/dL (ref 12.0–15.0)
Lymphocytes Relative: 37.5 % (ref 12.0–46.0)
Lymphs Abs: 2.3 10*3/uL (ref 0.7–4.0)
MCHC: 33.6 g/dL (ref 30.0–36.0)
MCV: 88.6 fl (ref 78.0–100.0)
Monocytes Absolute: 0.4 10*3/uL (ref 0.1–1.0)
Monocytes Relative: 7 % (ref 3.0–12.0)
Neutro Abs: 3.1 10*3/uL (ref 1.4–7.7)
Neutrophils Relative %: 50.3 % (ref 43.0–77.0)
Platelets: 292 10*3/uL (ref 150.0–400.0)
RBC: 4.7 Mil/uL (ref 3.87–5.11)
RDW: 14 % (ref 11.5–15.5)
WBC: 6.2 10*3/uL (ref 4.0–10.5)

## 2022-02-07 LAB — HEPATIC FUNCTION PANEL
ALT: 57 U/L — ABNORMAL HIGH (ref 0–35)
AST: 54 U/L — ABNORMAL HIGH (ref 0–37)
Albumin: 4.8 g/dL (ref 3.5–5.2)
Alkaline Phosphatase: 59 U/L (ref 39–117)
Bilirubin, Direct: 0.1 mg/dL (ref 0.0–0.3)
Total Bilirubin: 0.6 mg/dL (ref 0.2–1.2)
Total Protein: 7.9 g/dL (ref 6.0–8.3)

## 2022-02-07 LAB — BASIC METABOLIC PANEL
BUN: 18 mg/dL (ref 6–23)
CO2: 26 mEq/L (ref 19–32)
Calcium: 11.5 mg/dL — ABNORMAL HIGH (ref 8.4–10.5)
Chloride: 102 mEq/L (ref 96–112)
Creatinine, Ser: 0.83 mg/dL (ref 0.40–1.20)
GFR: 74.28 mL/min (ref >60.00)
Glucose, Bld: 86 mg/dL (ref 70–99)
Potassium: 4.1 mEq/L (ref 3.5–5.1)
Sodium: 138 mEq/L (ref 135–145)

## 2022-02-07 LAB — TSH: TSH: 1.33 u[IU]/mL (ref 0.35–5.50)

## 2022-02-07 NOTE — Patient Instructions (Signed)
Schedule your complete physical in 6 months ?We'll notify you of your lab results and make any changes if needed ?Try and work on healthy diet and regular exercise- you can do it! ?See what Dr Harlow Asa has to say about the parathyroid ?Call with any questions or concerns ?Have a great summer!!! ?

## 2022-02-07 NOTE — Assessment & Plan Note (Signed)
Chronic problem.  Currently on Lipitor '40mg'$  daily and Gemfibrozil '600mg'$  BID.  Asymptomatic.  Check labs.  No anticipated med changes. ?

## 2022-02-07 NOTE — Assessment & Plan Note (Signed)
Pt has dropped into the overweight category w/ a BMI of 29.62  Encouraged her to eat a low carb diet and get regular physical activity.  Husband is trying to get her to walk w/ him.  Encouraged her to do it!  Will follow. ?

## 2022-02-07 NOTE — Assessment & Plan Note (Signed)
Ongoing issue for pt.  She developed a kidney stone and required lithotripsy due to her high calcium levels.  Dr Chalmers Cater is recommending a consultation w/ Dr Harlow Asa for parathyroidectomy as they were unable to map the dominant gland.  Will follow along and assist as able. ?

## 2022-02-07 NOTE — Progress Notes (Signed)
? ?  Subjective:  ? ? Patient ID: Melissa Miles, female    DOB: May 29, 1956, 66 y.o.   MRN: [id number] ? ?HPI ?Hyperlipidemia- chronic problem, on Lipitor '40mg'$  daily and Gemfibrozil '600mg'$  BID.  Denies CP, SOB, abd pain, N/V ? ?Obesity- BMI 29.62.  She has lost 2 lbs and is now under the 30 threshold.  No regular exercise.  Husband is trying to get pt out walking. ? ?Hyperparathyroid- pt is seeing Dr Chalmers Cater.  Just recently had a kidney stone that required Lithotripsy.  She had a parathyroid scan in hopes of finding the dominant gland but this was unsuccessful.  She is supposed to have a consultation w/ Dr Harlow Asa to discuss surgical options ? ? ?Review of Systems ?For ROS see HPI  ?   ?Objective:  ? Physical Exam ?Vitals reviewed.  ?Constitutional:   ?   General: She is not in acute distress. ?   Appearance: Normal appearance. She is well-developed. She is not ill-appearing.  ?HENT:  ?   Head: Normocephalic and atraumatic.  ?Eyes:  ?   Conjunctiva/sclera: Conjunctivae normal.  ?   Pupils: Pupils are equal, round, and reactive to light.  ?Neck:  ?   Thyroid: No thyromegaly.  ?Cardiovascular:  ?   Rate and Rhythm: Normal rate and regular rhythm.  ?   Pulses: Normal pulses.  ?   Heart sounds: Normal heart sounds. No murmur heard. ?Pulmonary:  ?   Effort: Pulmonary effort is normal. No respiratory distress.  ?   Breath sounds: Normal breath sounds.  ?Abdominal:  ?   General: There is no distension.  ?   Palpations: Abdomen is soft.  ?   Tenderness: There is no abdominal tenderness.  ?Musculoskeletal:  ?   Cervical back: Normal range of motion and neck supple.  ?   Right lower leg: No edema.  ?   Left lower leg: No edema.  ?Lymphadenopathy:  ?   Cervical: No cervical adenopathy.  ?Skin: ?   General: Skin is warm and dry.  ?Neurological:  ?   General: No focal deficit present.  ?   Mental Status: She is alert and oriented to person, place, and time.  ?Psychiatric:     ?   Mood and Affect: Mood normal.     ?   Behavior:  Behavior normal.  ? ? ? ? ? ?   ?Assessment & Plan:  ? ? ?

## 2022-02-08 ENCOUNTER — Telehealth: Payer: Self-pay

## 2022-02-08 NOTE — Telephone Encounter (Signed)
-----   Message from Midge Minium, MD sent at 02/07/2022  8:45 PM EDT ----- Liver functions are better than they have been!  This is great news!  All other labs look good w/ exception of known high calcium.

## 2022-02-08 NOTE — Telephone Encounter (Signed)
Patient returned Melissa Miles's call. I gave her the lab results, she had no questions at this time

## 2022-03-13 ENCOUNTER — Other Ambulatory Visit: Payer: Self-pay | Admitting: Surgery

## 2022-03-13 DIAGNOSIS — E210 Primary hyperparathyroidism: Secondary | ICD-10-CM

## 2022-03-15 ENCOUNTER — Ambulatory Visit
Admission: RE | Admit: 2022-03-15 | Discharge: 2022-03-15 | Disposition: A | Discharge status: Home / Self Care | Payer: BC Managed Care – PPO | Source: Ambulatory Visit | Attending: Surgery | Admitting: Surgery

## 2022-03-15 DIAGNOSIS — E213 Hyperparathyroidism, unspecified: Secondary | ICD-10-CM | POA: Diagnosis not present

## 2022-03-15 DIAGNOSIS — E210 Primary hyperparathyroidism: Secondary | ICD-10-CM | POA: Diagnosis not present

## 2022-03-17 ENCOUNTER — Other Ambulatory Visit: Payer: Self-pay | Admitting: Family Medicine

## 2022-03-17 DIAGNOSIS — E785 Hyperlipidemia, unspecified: Secondary | ICD-10-CM

## 2022-03-27 NOTE — Progress Notes (Signed)
No significant thyroid disease.  Possible right inferior parathyroid adenoma.  Will get CT scan with parathyroid protocol in hopes of confirming the location of the adenoma.  CT scheduled for July 26th.  tmg  Armandina Gemma, [facility] [place] practice Office: [phone]

## 2022-04-18 ENCOUNTER — Ambulatory Visit
Admission: RE | Admit: 2022-04-18 | Discharge: 2022-04-18 | Disposition: A | Discharge status: Home / Self Care | Payer: BC Managed Care – PPO | Source: Ambulatory Visit | Attending: Surgery | Admitting: Surgery

## 2022-04-18 DIAGNOSIS — E213 Hyperparathyroidism, unspecified: Secondary | ICD-10-CM | POA: Diagnosis not present

## 2022-04-18 DIAGNOSIS — D351 Benign neoplasm of parathyroid gland: Secondary | ICD-10-CM | POA: Diagnosis not present

## 2022-04-18 DIAGNOSIS — E210 Primary hyperparathyroidism: Secondary | ICD-10-CM

## 2022-04-18 MED ORDER — IOPAMIDOL (ISOVUE-370) INJECTION 76%
75.0000 mL | Freq: Once | INTRAVENOUS | Status: AC | PRN
  Administered 2022-04-18: 75 mL via INTRAVENOUS

## 2022-04-20 ENCOUNTER — Other Ambulatory Visit: Payer: Self-pay | Admitting: Family Medicine

## 2022-04-24 ENCOUNTER — Telehealth: Payer: Self-pay | Admitting: Family Medicine

## 2022-04-24 ENCOUNTER — Other Ambulatory Visit: Payer: Self-pay

## 2022-04-24 MED ORDER — ATORVASTATIN CALCIUM 40 MG PO TABS: 40.0000 mg | ORAL_TABLET | Freq: Every day | ORAL | 1 refills | Status: DC

## 2022-04-24 NOTE — Telephone Encounter (Signed)
Encourage patient to contact the pharmacy for refills or they can request refills through [facility]  (Please schedule appointment if patient has not been seen in over a year)    [place] TO: [place] ([place] - [address]  MEDICATION NAME & DOSE: Atorvastatin 40 mg   NOTES/COMMENTS FROM PATIENT:PT called stating that she is about to be out of medication. Pt states that she is going out of town and she will only have enough medication to last her on her trip. Please advise       [place] office please notify patient: It takes 48-72 hours to process rx refill requests Ask patient to call pharmacy to ensure rx is ready before heading there.

## 2022-04-24 NOTE — Telephone Encounter (Signed)
Sent refill to pharmacy. 

## 2022-04-25 ENCOUNTER — Ambulatory Visit: Payer: Self-pay | Admitting: Surgery

## 2022-04-25 NOTE — Progress Notes (Signed)
CT scan localizes a right inferior adenoma.  There is a possible left inferior adenoma as well.  Will plan neck exploration with parathyroidectomy as an outpatient procedure.  Telephone message with findings left for patient today.  [facility] - please send orders to schedulers to contact patient.  tmg  Armandina Gemma, [facility] [place] practice Office: [phone]

## 2022-05-07 NOTE — Progress Notes (Signed)
COVID Vaccine received:  '[]'$  No '[x]'$  Yes Date of any COVID positive Test in last 90 days:  PCP - Annye Asa, MD Cardiologist -   Chest x-ray -  to be done at PST appt as per protocol EKG -  12-05-2021 Stress Test -  ECHO -  Cardiac Cath -   Pacemaker/ICD device     '[]'$  N/A Spinal Cord Stimulator:'[]'$  No '[]'$  Yes   Other Implants:   History of Sleep Apnea? '[]'$  No '[]'$  Yes   Sleep Study Date:   CPAP used?- '[]'$  No '[]'$  Yes  (Instruct to bring their mask & Tubing)  Does the patient monitor blood sugar? '[]'$  No '[]'$  Yes  '[]'$  N/A Does patient have a [employer] or Dexacom? '[]'$  No '[]'$  Yes   Fasting Blood Sugar Ranges-  Checks Blood Sugar _____ times a day  Blood Thinner Instructions: none Aspirin Instructions: Hold 5 days per Dr. Gala Lewandowsky order Last Dose:  ERAS Protocol Ordered: '[]'$  No  '[x]'$  Yes No Drink was ordered  Comments:   Activity level: Patient can / can not climb a flight of stairs without difficulty;  '[]'$  No CP  '[]'$  No SOB,  but would have ______   Anesthesia review: Hx elevated liver function tests; 2 x normal  Patient denies shortness of breath, fever, cough and chest pain at PAT appointment.  Patient verbalized understanding and agreement to the Pre-Surgical Instructions that were given to them at this PAT appointment. Patient was also educated of the need to review these PAT instructions again prior to his/her surgery.I reviewed the appropriate phone numbers to call if they have any and questions or concerns.

## 2022-05-07 NOTE — Patient Instructions (Signed)
SURGICAL WAITING ROOM VISITATION Patients having surgery or a procedure may have no more than 2 support people in the waiting area - these visitors may rotate.   Children under the age of 86 must have an adult with them who is not the patient. If the patient needs to stay at the hospital during part of their recovery, the visitor guidelines for inpatient rooms apply. Pre-op nurse will coordinate an appropriate time for 1 support person to accompany patient in pre-op.  This support person may not rotate.    Please refer to the [facility] website for the visitor guidelines for Inpatients (after your surgery is over and you are in a regular room).      Your procedure is scheduled on: 05-14-22   Report to [facility] Main Entrance    Report to admitting at 7:15 AM   Call this number if you have problems the morning of surgery [phone]   Do not eat food :After Midnight.   After Midnight you may have the following liquids until 6:30 AM DAY OF SURGERY  Water Non-Citrus Juices (without pulp, NO RED) Carbonated Beverages Black Coffee (NO MILK/CREAM OR CREAMERS, sugar ok)  Clear Tea (NO MILK/CREAM OR CREAMERS, sugar ok) regular and decaf                             Plain Jell-O (NO RED)                                           Fruit ices (not with fruit pulp, NO RED)                                     Popsicles (NO RED)                                                               Sports drinks like Gatorade (NO RED)                       If you have questions, please contact your surgeon's office.   FOLLOW  ANY ADDITIONAL PRE OP INSTRUCTIONS YOU RECEIVED FROM YOUR SURGEON'S OFFICE!!!     Oral Hygiene is also important to reduce your risk of infection.                                    Remember - BRUSH YOUR TEETH THE MORNING OF SURGERY WITH YOUR REGULAR TOOTHPASTE   Do NOT smoke after Midnight   Take these medicines the morning of surgery with A SIP OF WATER: Cymbalta,  Gemfibrozil, Atorvastatin                              You may not have any metal on your body including hair pins, jewelry, and body piercing             Do not wear make-up, lotions, powders,  perfumes or deodorant  Do not wear nail polish including gel and S&S, artificial/acrylic nails, or any other type of covering on natural nails including finger and toenails. If you have artificial nails, gel coating, etc. that needs to be removed by a nail salon please have this removed prior to surgery or surgery may need to be canceled/ delayed if the surgeon/ anesthesia feels like they are unable to be safely monitored.   Do not shave  48 hours prior to surgery.    Do not bring valuables to the hospital. [place].   Contacts, dentures or bridgework may not be worn into surgery.  DO NOT [place]. PHARMACY WILL DISPENSE MEDICATIONS LISTED ON YOUR MEDICATION LIST TO YOU DURING YOUR ADMISSION [place]!   Patients discharged on the day of surgery will not be allowed to drive home.  Someone NEEDS to stay with you for the first 24 hours after anesthesia.  Special Instructions: Bring a copy of your healthcare power of attorney and living will documents the day of surgery if you haven't scanned them before.   Please read over the following fact sheets you were given: IF YOU HAVE QUESTIONS ABOUT YOUR PRE-OP INSTRUCTIONS PLEASE CALL [place] - Preparing for Surgery Before surgery, you can play an important role.  Because skin is not sterile, your skin needs to be as free of germs as possible.  You can reduce the number of germs on your skin by washing with CHG (chlorahexidine gluconate) soap before surgery.  CHG is an antiseptic cleaner which kills germs and bonds with the skin to continue killing germs even after washing. Please DO NOT use if you have an allergy to CHG or antibacterial soaps.  If your skin  becomes reddened/irritated stop using the CHG and inform your nurse when you arrive at Short Stay. Do not shave (including legs and underarms) for at least 48 hours prior to the first CHG shower.  You may shave your face/neck.  Please follow these instructions carefully:  1.  Shower with CHG Soap the night before surgery and the  morning of surgery.  2.  If you choose to wash your hair, wash your hair first as usual with your normal  shampoo.  3.  After you shampoo, rinse your hair and body thoroughly to remove the shampoo.                             4.  Use CHG as you would any other liquid soap.  You can apply chg directly to the skin and wash.  Gently with a scrungie or clean washcloth.  5.  Apply the CHG Soap to your body ONLY FROM THE NECK DOWN.   Do   not use on face/ open                           Wound or open sores. Avoid contact with eyes, ears mouth and   genitals (private parts).                       Wash face,  Genitals (private parts) with your normal soap.             6.  Wash thoroughly, paying special attention to the area where your    surgery  will be performed.  7.  Thoroughly rinse your body with warm water from the neck down.  8.  DO NOT shower/wash with your normal soap after using and rinsing off the CHG Soap.                9.  Pat yourself dry with a clean towel.            10.  Wear clean pajamas.            11.  Place clean sheets on your bed the night of your first shower and do not  sleep with pets. Day of Surgery : Do not apply any lotions/deodorants the morning of surgery.  Please wear clean clothes to the hospital/surgery center.  FAILURE TO FOLLOW THESE INSTRUCTIONS MAY RESULT IN THE CANCELLATION OF YOUR SURGERY  PATIENT SIGNATURE_________________________________  NURSE SIGNATURE__________________________________  ________________________________________________________________________

## 2022-05-08 NOTE — Progress Notes (Signed)
COVID Vaccine Completed:  Yes  Date of COVID positive in last 90 days:  PCP - Aldona Lento, MD Cardiologist -   Chest x-ray -  EKG -  Stress Test -  ECHO -  Cardiac Cath -  Pacemaker/ICD device last checked: Spinal Cord Stimulator:  Bowel Prep -   Sleep Study -  CPAP -   Fasting Blood Sugar -  Checks Blood Sugar _____ times a day  Blood Thinner Instructions: Aspirin Instructions:  ASA 81 mg Last Dose:  Activity level:  Can go up a flight of stairs and perform activities of daily living without stopping and without symptoms of chest pain or shortness of breath.  Able to exercise without symptoms  Unable to go up a flight of stairs without symptoms of     Anesthesia review:   Patient denies shortness of breath, fever, cough and chest pain at PAT appointment  Patient verbalized understanding of instructions that were given to them at the PAT appointment. Patient was also instructed that they will need to review over the PAT instructions again at home before surgery.

## 2022-05-09 ENCOUNTER — Encounter (HOSPITAL_COMMUNITY): Payer: Self-pay

## 2022-05-09 ENCOUNTER — Other Ambulatory Visit: Payer: Self-pay

## 2022-05-09 ENCOUNTER — Encounter (HOSPITAL_COMMUNITY)
Admission: RE | Admit: 2022-05-09 | Discharge: 2022-05-09 | Disposition: A | Discharge status: Home / Self Care | Payer: BC Managed Care – PPO | Source: Ambulatory Visit | Attending: Surgery | Admitting: Surgery

## 2022-05-09 VITALS — BP 137/91 | HR 80 | Temp 98.5°F | Resp 16 | Ht 68.0 in | Wt 191.6 lb

## 2022-05-09 DIAGNOSIS — K769 Liver disease, unspecified: Secondary | ICD-10-CM | POA: Insufficient documentation

## 2022-05-09 DIAGNOSIS — Z01818 Encounter for other preprocedural examination: Secondary | ICD-10-CM | POA: Insufficient documentation

## 2022-05-09 HISTORY — DX: Personal history of urinary calculi: Z87.442

## 2022-05-09 HISTORY — DX: Hyperparathyroidism, unspecified: E21.3

## 2022-05-09 HISTORY — DX: Gastro-esophageal reflux disease without esophagitis: K21.9

## 2022-05-09 LAB — COMPREHENSIVE METABOLIC PANEL
ALT: 53 U/L — ABNORMAL HIGH (ref 0–44)
AST: 50 U/L — ABNORMAL HIGH (ref 15–41)
Albumin: 4.3 g/dL (ref 3.5–5.0)
Alkaline Phosphatase: 56 U/L (ref 38–126)
Anion gap: 10 (ref 5–15)
BUN: 22 mg/dL (ref 8–23)
CO2: 23 mmol/L (ref 22–32)
Calcium: 10.5 mg/dL — ABNORMAL HIGH (ref 8.9–10.3)
Chloride: 109 mmol/L (ref 98–111)
Creatinine, Ser: 0.77 mg/dL (ref 0.44–1.00)
GFR, Estimated: >60 mL/min (ref >60)
Glucose, Bld: 116 mg/dL — ABNORMAL HIGH (ref 70–99)
Potassium: 4.3 mmol/L (ref 3.5–5.1)
Sodium: 142 mmol/L (ref 135–145)
Total Bilirubin: 0.8 mg/dL (ref 0.3–1.2)
Total Protein: 8.1 g/dL (ref 6.5–8.1)

## 2022-05-09 LAB — CBC
HCT: 41.9 % (ref 36.0–46.0)
Hemoglobin: 13.5 g/dL (ref 12.0–15.0)
MCH: 30.1 pg (ref 26.0–34.0)
MCHC: 32.2 g/dL (ref 30.0–36.0)
MCV: 93.3 fL (ref 80.0–100.0)
Platelets: 255 10*3/uL (ref 150–400)
RBC: 4.49 MIL/uL (ref 3.87–5.11)
RDW: 13.2 % (ref 11.5–15.5)
WBC: 5.7 10*3/uL (ref 4.0–10.5)
nRBC: 0 % (ref 0.0–0.2)

## 2022-05-13 ENCOUNTER — Encounter (HOSPITAL_COMMUNITY): Payer: Self-pay | Admitting: Surgery

## 2022-05-13 NOTE — H&P (Signed)
REFERRING PHYSICIAN: Birdena Crandall, MD  PROVIDER: Marlane Hirschmann Charlotta Newton, MD    Chief Complaint: New Consultation (Primary hyperparathyroidism)  History of Present Illness:  Patient is furred by Dr. Jacelyn Pi for surgical evaluation and management of suspected primary hyperparathyroidism. Patient's primary care physician is Dr. Annye Asa. Patient is known to my practice from previous surgical procedures. Patient had developed hypercalcemia. This had been followed by her primary care physician and then referred to endocrinology for further evaluation. Patient also developed nephrolithiasis. She had had a bone density scan that showed osteopenia. Recent laboratory studies showed an elevated calcium level of 11.1 with an intact PTH level which was unsuppressed at 62.3. 25 hydroxy vitamin D level was normal at 44.9. 24-hour urinary calcium was normal at 135. Patient was referred for nuclear medicine parathyroid scan which was performed on January 02, 2022. This showed the possibility of a right inferior focus of radiotracer but was read as an otherwise negative study. Patient has not had additional imaging. Patient has had no prior head or neck surgery. There is no family history of endocrine neoplasm. 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  Review of Systems: A complete review of systems was obtained from the patient. I have reviewed this information and discussed as appropriate with the patient. See HPI as well for other ROS.  ROS   Medical History: History reviewed. No pertinent past medical history.  Patient Active Problem List  Diagnosis  Primary hyperparathyroidism (CMS-HCC)   Past Surgical History:  Procedure Laterality Date  CHOLECYSTECTOMY    No Known Allergies  Current Outpatient Medications on File Prior to Visit  Medication Sig Dispense Refill  atorvastatin (LIPITOR) 40 MG tablet atorvastatin 40 mg tablet TAKE 1 TABLET BY MOUTH ONCE  DAILY  DULoxetine (CYMBALTA) 60 MG DR capsule duloxetine 60 mg capsule,delayed release TAKE 1 CAPSULE BY MOUTH ONCE DAILY  gemfibroziL (LOPID) 600 mg tablet gemfibrozil 600 mg tablet TAKE 1 TABLET BY MOUTH TWICE DAILY BEFORE MEAL(S)  aspirin 81 MG chewable tablet 1 tablet  mv-min-folic acid-lutein (CENTRUM SILVER) 400-250 mcg Chew as directed   No current facility-administered medications on file prior to visit.   Family History  Problem Relation Age of Onset  High blood pressure (Hypertension) Mother  Hyperlipidemia (Elevated cholesterol) Mother  Coronary Artery Disease (Blocked arteries around heart) Sister  Coronary Artery Disease (Blocked arteries around heart) Brother    Social History   Tobacco Use  Smoking Status Never  Smokeless Tobacco Never    Social History   Socioeconomic History  Marital status: Married  Tobacco Use  Smoking status: Never  Smokeless tobacco: Never  Vaping Use  Vaping Use: Never used  Substance and Sexual Activity  Alcohol use: Not Currently  Drug use: Never   Objective:   Vitals:  BP: 120/72  Pulse: (!) 114  Temp: 36.2 C (97.1 F)  SpO2: 98%  Weight: 89.6 kg (197 lb 9.6 oz)  Height: 174 cm (5' 8.5")   Body mass index is 29.61 kg/m.  Physical Exam   GENERAL APPEARANCE Comfortable, no acute issues Development: normal Gross deformities: none  SKIN Rash, lesions, ulcers: none Induration, erythema: none Nodules: none palpable  EYES Conjunctiva and lids: normal Pupils: equal and reactive  EARS, NOSE, MOUTH, THROAT External ears: no lesion or deformity External nose: no lesion or deformity Hearing: grossly normal  NECK Symmetric: yes Trachea: midline Thyroid: no palpable nodules in the thyroid bed  CHEST Respiratory effort: normal Retraction or  accessory muscle use: no Breath sounds: normal bilaterally Rales, rhonchi, wheeze: none  CARDIOVASCULAR Auscultation: regular rhythm, normal rate Murmurs:  none Pulses: radial pulse 2+ palpable Lower extremity edema: none  ABDOMEN Not assessed  GENITOURINARY Not assessed  MUSCULOSKELETAL Station and gait: normal Digits and nails: no clubbing or cyanosis Muscle strength: grossly normal all extremities Range of motion: grossly normal all extremities Deformity: none  LYMPHATIC Cervical: none palpable Supraclavicular: none palpable  PSYCHIATRIC Oriented to person, place, and time: yes Mood and affect: normal for situation Judgment and insight: appropriate for situation   Assessment and Plan:   Primary hyperparathyroidism (CMS-HCC)  Patient is referred by her endocrinologist, Dr. Jacelyn Pi, for surgical evaluation of suspected primary hyperparathyroidism with complications including nephrolithiasis and osteopenia. Patient is provided with written literature to review at home.  Patient provided with a copy of "Parathyroid Surgery: Treatment for Your Parathyroid Gland Problem", published by [employer], 12 pages. Book reviewed and explained to patient during visit today.  Patient has biochemical evidence of primary hyperparathyroidism. Patient underwent a nuclear medicine parathyroid scan which showed a questionable focus of abnormal retention at the right inferior position. However the study was read as negative. Therefore I would like to obtain additional imaging to include an ultrasound examination of the neck and a 4D CT scan with parathyroid protocol. Hopefully these studies will confirm the presence of an adenoma and give [place] the location so that she is a candidate for minimally invasive outpatient surgery. We discussed this today. The patient understands and agrees to proceed with the studies in anticipation of possible surgery.  Patient will undergo the above studies. We will contact her with the results and make further plans for management at that time.  Armandina Gemma, MD [facility] [place] practice Office:  [phone]

## 2022-05-14 ENCOUNTER — Other Ambulatory Visit: Payer: Self-pay

## 2022-05-14 ENCOUNTER — Encounter (HOSPITAL_COMMUNITY): Payer: Self-pay | Admitting: Surgery

## 2022-05-14 ENCOUNTER — Ambulatory Visit (HOSPITAL_COMMUNITY)
Admission: RE | Admit: 2022-05-14 | Discharge: 2022-05-14 | Disposition: A | Discharge status: Home / Self Care | Payer: BC Managed Care – PPO | Attending: Surgery | Admitting: Surgery

## 2022-05-14 ENCOUNTER — Ambulatory Visit (HOSPITAL_COMMUNITY): Payer: BC Managed Care – PPO | Admitting: Anesthesiology

## 2022-05-14 ENCOUNTER — Encounter (HOSPITAL_COMMUNITY)
Admission: RE | Disposition: A | Discharge status: Home / Self Care | Payer: Self-pay | Source: Home / Self Care | Attending: Surgery

## 2022-05-14 DIAGNOSIS — N200 Calculus of kidney: Secondary | ICD-10-CM | POA: Insufficient documentation

## 2022-05-14 DIAGNOSIS — E210 Primary hyperparathyroidism: Secondary | ICD-10-CM | POA: Diagnosis present

## 2022-05-14 DIAGNOSIS — F419 Anxiety disorder, unspecified: Secondary | ICD-10-CM | POA: Diagnosis not present

## 2022-05-14 DIAGNOSIS — Z01818 Encounter for other preprocedural examination: Secondary | ICD-10-CM

## 2022-05-14 DIAGNOSIS — M8580 Other specified disorders of bone density and structure, unspecified site: Secondary | ICD-10-CM | POA: Insufficient documentation

## 2022-05-14 DIAGNOSIS — D351 Benign neoplasm of parathyroid gland: Secondary | ICD-10-CM | POA: Diagnosis not present

## 2022-05-14 HISTORY — PX: PARATHYROIDECTOMY: SHX19

## 2022-05-14 SURGERY — PARATHYROIDECTOMY
Anesthesia: General | Site: Neck

## 2022-05-14 MED ORDER — MIDAZOLAM HCL 2 MG/2ML IJ SOLN
INTRAMUSCULAR | Status: AC
  Filled 2022-05-14: qty 2

## 2022-05-14 MED ORDER — FENTANYL CITRATE PF 50 MCG/ML IJ SOSY
PREFILLED_SYRINGE | INTRAMUSCULAR | Status: AC
  Filled 2022-05-14: qty 1

## 2022-05-14 MED ORDER — LIDOCAINE 2% (20 MG/ML) 5 ML SYRINGE
INTRAMUSCULAR | Status: AC
Start: 2022-05-14 — End: ?
  Filled 2022-05-14: qty 5

## 2022-05-14 MED ORDER — CHLORHEXIDINE GLUCONATE CLOTH 2 % EX PADS: 6.0000 | MEDICATED_PAD | Freq: Once | CUTANEOUS | Status: DC

## 2022-05-14 MED ORDER — PHENYLEPHRINE 80 MCG/ML (10ML) SYRINGE FOR IV PUSH (FOR BLOOD PRESSURE SUPPORT)
PREFILLED_SYRINGE | INTRAVENOUS | Status: AC
Start: 2022-05-14 — End: ?
  Filled 2022-05-14: qty 10

## 2022-05-14 MED ORDER — AMISULPRIDE (ANTIEMETIC) 5 MG/2ML IV SOLN: 10.0000 mg | Freq: Once | INTRAVENOUS | Status: DC | PRN

## 2022-05-14 MED ORDER — LIDOCAINE HCL (CARDIAC) PF 100 MG/5ML IV SOSY
PREFILLED_SYRINGE | INTRAVENOUS | Status: DC | PRN
  Administered 2022-05-14: 50 mg via INTRAVENOUS

## 2022-05-14 MED ORDER — TRAMADOL HCL 50 MG PO TABS: 50.0000 mg | ORAL_TABLET | Freq: Four times a day (QID) | ORAL | 0 refills | Status: DC | PRN

## 2022-05-14 MED ORDER — FENTANYL CITRATE (PF) 250 MCG/5ML IJ SOLN
INTRAMUSCULAR | Status: AC
  Filled 2022-05-14: qty 5

## 2022-05-14 MED ORDER — 0.9 % SODIUM CHLORIDE (POUR BTL) OPTIME
TOPICAL | Status: DC | PRN
  Administered 2022-05-14: 1000 mL

## 2022-05-14 MED ORDER — FENTANYL CITRATE (PF) 100 MCG/2ML IJ SOLN
INTRAMUSCULAR | Status: DC | PRN
  Administered 2022-05-14: 50 ug via INTRAVENOUS
  Administered 2022-05-14: 100 ug via INTRAVENOUS
  Administered 2022-05-14: 50 ug via INTRAVENOUS

## 2022-05-14 MED ORDER — MIDAZOLAM HCL 5 MG/5ML IJ SOLN
INTRAMUSCULAR | Status: DC | PRN
  Administered 2022-05-14: 2 mg via INTRAVENOUS

## 2022-05-14 MED ORDER — BUPIVACAINE HCL 0.5 % IJ SOLN
INTRAMUSCULAR | Status: DC | PRN
  Administered 2022-05-14: 15 mL

## 2022-05-14 MED ORDER — ONDANSETRON HCL 4 MG/2ML IJ SOLN
INTRAMUSCULAR | Status: DC | PRN
  Administered 2022-05-14: 4 mg via INTRAVENOUS

## 2022-05-14 MED ORDER — PHENYLEPHRINE HCL (PRESSORS) 10 MG/ML IV SOLN
INTRAVENOUS | Status: DC | PRN
  Administered 2022-05-14 (×2): 160 ug via INTRAVENOUS

## 2022-05-14 MED ORDER — SUGAMMADEX SODIUM 500 MG/5ML IV SOLN
INTRAVENOUS | Status: DC | PRN
  Administered 2022-05-14 (×2): 100 mg via INTRAVENOUS

## 2022-05-14 MED ORDER — EPHEDRINE SULFATE (PRESSORS) 50 MG/ML IJ SOLN
INTRAMUSCULAR | Status: DC | PRN
  Administered 2022-05-14 (×2): 5 mg via INTRAVENOUS

## 2022-05-14 MED ORDER — OXYCODONE HCL 5 MG PO TABS: 5.0000 mg | ORAL_TABLET | Freq: Once | ORAL | Status: DC | PRN

## 2022-05-14 MED ORDER — DEXAMETHASONE SODIUM PHOSPHATE 10 MG/ML IJ SOLN
INTRAMUSCULAR | Status: AC
Start: 2022-05-14 — End: ?
  Filled 2022-05-14: qty 1

## 2022-05-14 MED ORDER — HEMOSTATIC AGENTS (NO CHARGE) OPTIME
TOPICAL | Status: DC | PRN
  Administered 2022-05-14: 1

## 2022-05-14 MED ORDER — ROCURONIUM BROMIDE 100 MG/10ML IV SOLN
INTRAVENOUS | Status: DC | PRN
  Administered 2022-05-14: 60 mg via INTRAVENOUS

## 2022-05-14 MED ORDER — ONDANSETRON HCL 4 MG/2ML IJ SOLN
INTRAMUSCULAR | Status: AC
Start: 2022-05-14 — End: ?
  Filled 2022-05-14: qty 2

## 2022-05-14 MED ORDER — LACTATED RINGERS IV SOLN: INTRAVENOUS | Status: DC

## 2022-05-14 MED ORDER — PROPOFOL 10 MG/ML IV BOLUS
INTRAVENOUS | Status: AC
  Filled 2022-05-14: qty 20

## 2022-05-14 MED ORDER — PROPOFOL 10 MG/ML IV BOLUS
INTRAVENOUS | Status: DC | PRN
  Administered 2022-05-14: 140 mg via INTRAVENOUS

## 2022-05-14 MED ORDER — CHLORHEXIDINE GLUCONATE 0.12 % MT SOLN
15.0000 mL | Freq: Once | OROMUCOSAL | Status: AC
  Administered 2022-05-14: 15 mL via OROMUCOSAL

## 2022-05-14 MED ORDER — EPHEDRINE 5 MG/ML INJ
INTRAVENOUS | Status: AC
  Filled 2022-05-14: qty 5

## 2022-05-14 MED ORDER — ACETAMINOPHEN 10 MG/ML IV SOLN
INTRAVENOUS | Status: AC
  Filled 2022-05-14: qty 100

## 2022-05-14 MED ORDER — BUPIVACAINE HCL (PF) 0.5 % IJ SOLN
INTRAMUSCULAR | Status: AC
  Filled 2022-05-14: qty 30

## 2022-05-14 MED ORDER — ACETAMINOPHEN 325 MG PO TABS: 325.0000 mg | ORAL_TABLET | ORAL | Status: DC | PRN

## 2022-05-14 MED ORDER — DEXAMETHASONE SODIUM PHOSPHATE 10 MG/ML IJ SOLN
INTRAMUSCULAR | Status: DC | PRN
  Administered 2022-05-14: 8 mg via INTRAVENOUS

## 2022-05-14 MED ORDER — ACETAMINOPHEN 10 MG/ML IV SOLN
1000.0000 mg | Freq: Once | INTRAVENOUS | Status: DC | PRN
  Administered 2022-05-14: 1000 mg via INTRAVENOUS

## 2022-05-14 MED ORDER — PROMETHAZINE HCL 25 MG/ML IJ SOLN: 6.2500 mg | INTRAMUSCULAR | Status: DC | PRN

## 2022-05-14 MED ORDER — ACETAMINOPHEN 160 MG/5ML PO SOLN: 325.0000 mg | ORAL | Status: DC | PRN

## 2022-05-14 MED ORDER — ORAL CARE MOUTH RINSE: 15.0000 mL | Freq: Once | OROMUCOSAL | Status: AC

## 2022-05-14 MED ORDER — FENTANYL CITRATE PF 50 MCG/ML IJ SOSY
25.0000 ug | PREFILLED_SYRINGE | INTRAMUSCULAR | Status: DC | PRN
  Administered 2022-05-14 (×2): 25 ug via INTRAVENOUS
  Administered 2022-05-14: 50 ug via INTRAVENOUS

## 2022-05-14 MED ORDER — OXYCODONE HCL 5 MG/5ML PO SOLN: 5.0000 mg | Freq: Once | ORAL | Status: DC | PRN

## 2022-05-14 MED ORDER — CEFAZOLIN SODIUM-DEXTROSE 2-4 GM/100ML-% IV SOLN
2.0000 g | INTRAVENOUS | Status: AC
  Administered 2022-05-14: 2 g via INTRAVENOUS
  Filled 2022-05-14: qty 100

## 2022-05-14 SURGICAL SUPPLY — 39 items
ADH SKN CLS APL DERMABOND .7 (GAUZE/BANDAGES/DRESSINGS) ×1
APL PRP STRL LF DISP 70% ISPRP (MISCELLANEOUS) ×1
ATTRACTOMAT 16X20 MAGNETIC DRP (DRAPES) ×2 IMPLANT
BAG COUNTER SPONGE SURGICOUNT (BAG) ×2 IMPLANT
BAG SPNG CNTER NS LX DISP (BAG) ×1
BLADE SURG 15 STRL LF DISP TIS (BLADE) ×2 IMPLANT
BLADE SURG 15 STRL SS (BLADE) ×1
CHLORAPREP W/TINT 26 (MISCELLANEOUS) ×2 IMPLANT
CLIP TI MEDIUM 6 (CLIP) ×4 IMPLANT
CLIP TI WIDE RED SMALL 6 (CLIP) ×4 IMPLANT
COVER SURGICAL LIGHT HANDLE (MISCELLANEOUS) ×2 IMPLANT
DERMABOND IMPLANT
DERMABOND ADVANCED (GAUZE/BANDAGES/DRESSINGS) ×1
DERMABOND ADVANCED .7 DNX12 (GAUZE/BANDAGES/DRESSINGS) ×2 IMPLANT
DRAPE LAPAROTOMY T 98X78 PEDS (DRAPES) ×2 IMPLANT
DRAPE UTILITY XL STRL (DRAPES) ×2 IMPLANT
ELECT REM PT RETURN 15FT ADLT (MISCELLANEOUS) ×2 IMPLANT
GAUZE 4X4 16PLY ~~LOC~~+RFID DBL (SPONGE) ×2 IMPLANT
GLOVE SURG ORTHO 8.0 STRL STRW (GLOVE) ×2 IMPLANT
GLOVE SURG SYN 7.5  E (GLOVE) ×3
GLOVE SURG SYN 7.5 E (GLOVE) ×3 IMPLANT
GLOVE SURG SYN 7.5 PF PI (GLOVE) ×6 IMPLANT
GOWN STRL REUS W/ TWL XL LVL3 (GOWN DISPOSABLE) ×6 IMPLANT
GOWN STRL REUS W/TWL XL LVL3 (GOWN DISPOSABLE) ×3
HEMOSTAT SURGICEL 2X4 FIBR (HEMOSTASIS) ×2 IMPLANT
ILLUMINATOR WAVEGUIDE N/F (MISCELLANEOUS) IMPLANT
KIT BASIN OR (CUSTOM PROCEDURE TRAY) ×2 IMPLANT
KIT TURNOVER KIT A (KITS) IMPLANT
NDL HYPO 25X1 1.5 SAFETY (NEEDLE) ×2 IMPLANT
NEEDLE HYPO 25X1 1.5 SAFETY (NEEDLE) ×1 IMPLANT
PACK BASIC VI WITH GOWN DISP (CUSTOM PROCEDURE TRAY) ×2 IMPLANT
PENCIL SMOKE EVACUATOR (MISCELLANEOUS) ×2 IMPLANT
SUT MNCRL AB 4-0 PS2 18 (SUTURE) ×2 IMPLANT
SUT VIC AB 3-0 SH 18 (SUTURE) ×2 IMPLANT
SYR BULB IRRIG 60ML STRL (SYRINGE) ×2 IMPLANT
SYR CONTROL 10ML LL (SYRINGE) ×2 IMPLANT
TOWEL OR 17X26 10 PK STRL BLUE (TOWEL DISPOSABLE) ×2 IMPLANT
TOWEL OR NON WOVEN STRL DISP B (DISPOSABLE) ×2 IMPLANT
TUBING CONNECTING 10 (TUBING) ×2 IMPLANT

## 2022-05-14 NOTE — Op Note (Signed)
Operative Note  Pre-operative Diagnosis:  Primary hyperparathyroidism  Post-operative Diagnosis:  same  Surgeon:  Armandina Gemma, MD  Assistant:  Carlena Hurl Maczis, PA-C   Procedure:  Neck exploration, bilateral parathyroidectomy (two glands)  Anesthesia:  general  Estimated Blood Loss:  minimal  Drains: none         Specimen: bilateral parathyroid glands to pathology (two glands)  Indications:  Patient is referred by Dr. Jacelyn Pi for surgical evaluation and management of suspected primary hyperparathyroidism. Patient's primary care physician is Dr. Annye Asa. Patient is known to my practice from previous surgical procedures. Patient had developed hypercalcemia. This had been followed by her primary care physician and then referred to endocrinology for further evaluation. Patient also developed nephrolithiasis. She had had a bone density scan that showed osteopenia. Recent laboratory studies showed an elevated calcium level of 11.1 with an intact PTH level which was unsuppressed at 62.3. 25 hydroxy vitamin D level was normal at 44.9. 24-hour urinary calcium was normal at 135. Patient was referred for nuclear medicine parathyroid scan which was performed on January 02, 2022. This showed the possibility of a right inferior focus of radiotracer but was read as an otherwise negative study.  A 4D CT scan of the neck identified a 15 x 6 mm mass in the right inferior position in an ectopic location adjacent to the esophagus which seem to correspond to the signal seen on nuclear medicine scanning.  Also noted was a potential inferior parathyroid gland on the left side.  Therefore the patient comes to neck exploration for parathyroidectomy.  Procedure:  The patient was seen in the pre-op holding area. The risks, benefits, complications, treatment options, and expected outcomes were previously discussed with the patient. The patient agreed with the proposed plan and has signed the informed  consent form.  The patient was brought to the operating room by the surgical team, identified as Melissa Miles and the procedure verified. A "time out" was completed and the above information confirmed.  Following administration of general anesthesia the patient is positioned and then prepped and draped in the usual aseptic fashion.  After ascertaining that an adequate level of anesthesia been obtained, a Kocher incision is made with a #15 blade.  Dissection is carried through subcutaneous tissues and platysma.  Hemostasis is achieved with the electrocautery.  Subplatysmal flaps are developed cephalad and caudad.  A [place] self-retaining retractor is placed for exposure.  Strap muscles are incised in the midline.  Dissection was begun on the right side.  Strap muscles were reflected laterally exposing a normal right thyroid lobe.  The lobe was mobilized and small venous tributaries were divided between ligaclips.  Exploration inferiorly and posteriorly reveals an enlarged parathyroid gland lying against the precervical fascia adjacent to the esophagus.  This appears to correspond with the tissue seen on CT scan.  This gland lies immediately behind the inferior thyroid artery but the vascular pedicle appears to originate superiorly.  Most likely this represents a right superior parathyroid adenoma.  The gland is mobilized.  Vascular pedicles divided between small and medium ligaclips and the gland is excised.  It is submitted to pathology where frozen section confirms hypercellular parathyroid tissue consistent with a parathyroid adenoma.  Further exploration on the right side both at the inferior pole of the thyroid lobe and posterior to the superior pole of the thyroid lobe fails to reveal any other evidence of enlarged parathyroid tissue.  Strap muscles are reflected to the left exposing a  normal left thyroid lobe.  Left lobe is gently mobilized with venous tributaries divided between ligaclips.   Exploration reveals what appears to be an enlarged parathyroid gland with a generous amount of adipose tissue surrounding it.  Again this is at the level of the inferior thyroid artery.  It is difficult to tell whether or not this represents a superior or and inferior gland.  Based on its location against the thyroid capsule it is likely an inferior parathyroid.  It is mobilized and vascular structures are again divided between small ligaclips and the gland is excised.  It is submitted to pathology where frozen section confirms parathyroid tissue.  However this gland does not appear to be hypercellular and may be normal parathyroid tissue.  Additional exploration on the left side behind the superior pole of the thyroid and around the inferior pole of the thyroid lobe and into the thyroid thymic tract fails to reveal any evidence of enlarged parathyroid glands.  The neck is irrigated with warm saline.  Good hemostasis is achieved throughout the operative field.  Fibrillar is placed throughout the operative field.  Strap muscles are reapproximated in the midline with interrupted 3-0 Vicryl sutures.  Platysma was closed with interrupted 3-0 Vicryl sutures.  Skin is anesthetized with local anesthetic.  Skin edges are reapproximated with a running 4-0 Monocryl subcuticular suture.  Wound is washed and dried and Dermabond is applied as dressing.  Patient is awakened from anesthesia and transported to the recovery room in stable condition.  The patient tolerated the procedure well.   Armandina Gemma, [facility] Office: [phone]

## 2022-05-14 NOTE — Anesthesia Postprocedure Evaluation (Signed)
Anesthesia Post Note  Patient: Melissa Miles  Procedure(s) Performed: NECK EXPLORATION WITH PARATHYROIDECTOMY (Neck)     Patient location during evaluation: PACU Anesthesia Type: General Level of consciousness: awake and alert Pain management: pain level controlled Vital Signs Assessment: post-procedure vital signs reviewed and stable Respiratory status: spontaneous breathing, nonlabored ventilation, respiratory function stable and patient connected to nasal cannula oxygen Cardiovascular status: blood pressure returned to baseline and stable Postop Assessment: no apparent nausea or vomiting Anesthetic complications: no   No notable events documented.  Last Vitals:  Vitals:   05/14/22 1305 05/14/22 1315  BP: (!) 168/85 (!) 149/83  Pulse: 80 75  Resp: 14   Temp:    SpO2: 95% 92%    Last Pain:  Vitals:   05/14/22 1305  TempSrc:   PainSc: 3                  Effie Berkshire

## 2022-05-14 NOTE — Transfer of Care (Signed)
Immediate Anesthesia Transfer of Care Note  Patient: Melissa Miles  Procedure(s) Performed: NECK EXPLORATION WITH PARATHYROIDECTOMY (Neck)  Patient Location: PACU  Anesthesia Type:General  Level of Consciousness: awake, alert , oriented and patient cooperative  Airway & Oxygen Therapy: Patient Spontanous Breathing and Patient connected to face mask oxygen  Post-op Assessment: Report given to RN, Post -op Vital signs reviewed and stable and Patient moving all extremities X 4  Post vital signs: Reviewed and stable  Last Vitals:  Vitals Value Taken Time  BP 166/87 05/14/22 1125  Temp    Pulse 76 05/14/22 1126  Resp 15 05/14/22 1126  SpO2 96 % 05/14/22 1126  Vitals shown include unvalidated device data.  Last Pain:  Vitals:   05/14/22 0752  TempSrc: Oral  PainSc:          Complications: No notable events documented.

## 2022-05-14 NOTE — Discharge Instructions (Addendum)
[facility] - Dr. Todd Gerkin  THYROID & PARATHYROID SURGERY:  POST-OP INSTRUCTIONS  Always review the instruction sheet provided by the hospital nurse at discharge.  A prescription for pain medication may be sent to your pharmacy at the time of discharge.  Take your pain medication as prescribed.  If narcotic pain medicine is not needed, then you may take acetaminophen (Tylenol) or ibuprofen (Advil) as needed for pain or soreness.  Take your normal home medications as prescribed unless otherwise directed.  If you need a refill on your pain medication, please contact the office during regular business hours.  Prescriptions will not be processed by the office after 5:00PM or on weekends.  Start with a light diet upon arrival home, such as soup and crackers or toast.  Be sure to drink plenty of fluids.  Resume your normal diet the day after surgery.  Most patients will experience some swelling and bruising on the chest and neck area.  Ice packs will help for the first 48 hours after arriving home.  Swelling and bruising will take several days to resolve.   It is common to experience some constipation after surgery.  Increasing fluid intake and taking a stool softener (Colace) will usually help to prevent this problem.  A mild laxative (Milk of Magnesia or Miralax) should be taken according to package directions if there has been no bowel movement after 48 hours.  Dermabond glue covers your incision. This seals the wound and you may shower at any time. The Dermabond will remain in place for about a week.  You may gradually remove the glue when it loosens around the edges.  If you need to loosen the Dermabond for removal, apply a layer of Vaseline to the wound for 15 minutes and then remove with a Kleenex. Your sutures are under the skin and will not show - they will dissolve on their own.  You may resume light daily activities beginning the day after discharge (such as self-care,  walking, climbing stairs), gradually increasing activities as tolerated. You may have sexual intercourse when it is comfortable. Refrain from any heavy lifting or straining until approved by your doctor. You may drive when you no longer are taking prescription pain medication, you can comfortably wear a seatbelt, and you can safely maneuver your car and apply the brakes.  You will see your doctor in the office for a follow-up appointment approximately three weeks after your surgery.  Make sure that you call for this appointment within a day or two after you arrive home to insure a convenient appointment time. Please have any requested laboratory tests performed a few days prior to your office visit so that the results will be available at your follow up appointment.  WHEN TO CALL THE [facility] OFFICE: -- Fever greater than 101.5 -- Inability to urinate -- Nausea and/or vomiting - persistent -- Extreme swelling or bruising -- Continued bleeding from incision -- Increased pain, redness, or drainage from the incision -- Difficulty swallowing or breathing -- Muscle cramping or spasms -- Numbness or tingling in hands or around lips  The clinic staff is available to answer your questions during regular business hours.  Please don't hesitate to call and ask to speak to one of the nurses if you have concerns.  [facility] OFFICE: [phone] (24 hours)  Please sign up for MyChart accounts. This will allow you to communicate directly with my nurse or myself without having to call the office. It will also allow you   to view your test results. You will need to enroll in MyChart for my office ([facility]) and for the hospital ([place]).  Todd Gerkin, MD [facility] practice 

## 2022-05-14 NOTE — Anesthesia Preprocedure Evaluation (Addendum)
Anesthesia Evaluation  Patient identified by MRN, date of birth, ID band Patient awake    Reviewed: Allergy & Precautions, NPO status , Patient's Chart, lab work & pertinent test results  Airway Mallampati: II  TM Distance: >3 FB Neck ROM: Full    Dental  (+) Teeth Intact, Dental Advisory Given   Pulmonary neg pulmonary ROS,    breath sounds clear to auscultation       Cardiovascular negative cardio ROS   Rhythm:Regular Rate:Normal     Neuro/Psych Anxiety negative neurological ROS     GI/Hepatic Neg liver ROS, GERD  ,  Endo/Other  negative endocrine ROS  Renal/GU negative Renal ROS     Musculoskeletal negative musculoskeletal ROS (+)   Abdominal   Peds  Hematology negative hematology ROS (+)   Anesthesia Other Findings   Reproductive/Obstetrics                            Anesthesia Physical Anesthesia Plan  ASA: 2  Anesthesia Plan: General   Post-op Pain Management:    Induction: Intravenous  PONV Risk Score and Plan: 4 or greater and Ondansetron, Dexamethasone, Midazolam and Scopolamine patch - Pre-op  Airway Management Planned: Oral ETT  Additional Equipment: None  Intra-op Plan:   Post-operative Plan: Extubation in OR  Informed Consent: I have reviewed the patients History and Physical, chart, labs and discussed the procedure including the risks, benefits and alternatives for the proposed anesthesia with the patient or authorized representative who has indicated his/her understanding and acceptance.     Dental advisory given  Plan Discussed with: CRNA  Anesthesia Plan Comments:        Anesthesia Quick Evaluation

## 2022-05-14 NOTE — Anesthesia Procedure Notes (Signed)
Procedure Name: Intubation Date/Time: 05/14/2022 9:36 AM  Performed by: Jonna Munro, CRNAPre-anesthesia Checklist: Emergency Drugs available, Patient identified, Suction available, Patient being monitored and Timeout performed Patient Re-evaluated:Patient Re-evaluated prior to induction Oxygen Delivery Method: Circle system utilized Preoxygenation: Pre-oxygenation with 100% oxygen Induction Type: IV induction Ventilation: Mask ventilation without difficulty and Oral airway inserted - appropriate to patient size Laryngoscope Size: Mac and 3 Grade View: Grade II Tube type: Oral Tube size: 7.0 mm Number of attempts: 1 Airway Equipment and Method: Stylet Placement Confirmation: ETT inserted through vocal cords under direct vision, positive ETCO2, CO2 detector and breath sounds checked- equal and bilateral Secured at: 22 cm Tube secured with: Tape Dental Injury: Teeth and Oropharynx as per pre-operative assessment

## 2022-05-14 NOTE — Interval H&P Note (Signed)
History and Physical Interval Note:  05/14/2022 8:56 AM  Melissa Miles  has presented today for surgery, with the diagnosis of PRIMARY HYPERPARATHYROIDISM.  The various methods of treatment have been discussed with the patient and family. After consideration of risks, benefits and other options for treatment, the patient has consented to    Procedure(s): NECK EXPLORATION WITH PARATHYROIDECTOMY (N/A) as a surgical intervention.    The patient's history has been reviewed, patient examined, no change in status, stable for surgery.  I have reviewed the patient's chart and labs.  Questions were answered to the patient's satisfaction.    Armandina Gemma, [facility] [place] practice Office: [place]

## 2022-05-15 ENCOUNTER — Encounter (HOSPITAL_COMMUNITY): Payer: Self-pay | Admitting: Surgery

## 2022-05-15 LAB — SURGICAL PATHOLOGY

## 2022-06-06 ENCOUNTER — Other Ambulatory Visit: Payer: Self-pay | Admitting: Family Medicine

## 2022-06-06 DIAGNOSIS — E785 Hyperlipidemia, unspecified: Secondary | ICD-10-CM

## 2022-06-19 DIAGNOSIS — E892 Postprocedural hypoparathyroidism: Secondary | ICD-10-CM | POA: Diagnosis not present

## 2022-06-21 ENCOUNTER — Other Ambulatory Visit: Payer: Self-pay | Admitting: Family Medicine

## 2022-07-31 ENCOUNTER — Emergency Department (HOSPITAL_BASED_OUTPATIENT_CLINIC_OR_DEPARTMENT_OTHER): Payer: Medicare Other

## 2022-07-31 ENCOUNTER — Encounter (HOSPITAL_BASED_OUTPATIENT_CLINIC_OR_DEPARTMENT_OTHER): Payer: Self-pay | Admitting: Emergency Medicine

## 2022-07-31 ENCOUNTER — Ambulatory Visit: Payer: Medicare Other | Admitting: Family Medicine

## 2022-07-31 ENCOUNTER — Other Ambulatory Visit: Payer: Self-pay

## 2022-07-31 ENCOUNTER — Emergency Department (HOSPITAL_BASED_OUTPATIENT_CLINIC_OR_DEPARTMENT_OTHER)
Admission: EM | Admit: 2022-07-31 | Discharge: 2022-07-31 | Disposition: A | Discharge status: Home / Self Care | Payer: Medicare Other | Attending: Emergency Medicine | Admitting: Emergency Medicine

## 2022-07-31 ENCOUNTER — Emergency Department (HOSPITAL_BASED_OUTPATIENT_CLINIC_OR_DEPARTMENT_OTHER)
Admission: EM | Admit: 2022-07-31 | Discharge: 2022-07-31 | Disposition: A | Discharge status: Home / Self Care | Payer: Medicare Other | Source: Home / Self Care | Attending: Emergency Medicine | Admitting: Emergency Medicine

## 2022-07-31 DIAGNOSIS — R519 Headache, unspecified: Secondary | ICD-10-CM | POA: Diagnosis not present

## 2022-07-31 DIAGNOSIS — Z7982 Long term (current) use of aspirin: Secondary | ICD-10-CM | POA: Insufficient documentation

## 2022-07-31 DIAGNOSIS — Z1152 Encounter for screening for COVID-19: Secondary | ICD-10-CM | POA: Insufficient documentation

## 2022-07-31 DIAGNOSIS — M5481 Occipital neuralgia: Secondary | ICD-10-CM | POA: Diagnosis not present

## 2022-07-31 LAB — COMPREHENSIVE METABOLIC PANEL
ALT: 65 U/L — ABNORMAL HIGH (ref 0–44)
AST: 51 U/L — ABNORMAL HIGH (ref 15–41)
Albumin: 5.1 g/dL — ABNORMAL HIGH (ref 3.5–5.0)
Alkaline Phosphatase: 56 U/L (ref 38–126)
Anion gap: 12 (ref 5–15)
BUN: 17 mg/dL (ref 8–23)
CO2: 24 mmol/L (ref 22–32)
Calcium: 10.1 mg/dL (ref 8.9–10.3)
Chloride: 101 mmol/L (ref 98–111)
Creatinine, Ser: 0.67 mg/dL (ref 0.44–1.00)
GFR, Estimated: >60 mL/min (ref >60)
Glucose, Bld: 127 mg/dL — ABNORMAL HIGH (ref 70–99)
Potassium: 3.9 mmol/L (ref 3.5–5.1)
Sodium: 137 mmol/L (ref 135–145)
Total Bilirubin: 0.6 mg/dL (ref 0.3–1.2)
Total Protein: 8.5 g/dL — ABNORMAL HIGH (ref 6.5–8.1)

## 2022-07-31 LAB — SARS CORONAVIRUS 2 BY RT PCR: SARS Coronavirus 2 by RT PCR: NEGATIVE

## 2022-07-31 LAB — CBC WITH DIFFERENTIAL/PLATELET
Abs Immature Granulocytes: 0.02 10*3/uL (ref 0.00–0.07)
Basophils Absolute: 0 10*3/uL (ref 0.0–0.1)
Basophils Relative: 0 %
Eosinophils Absolute: 0 10*3/uL (ref 0.0–0.5)
Eosinophils Relative: 0 %
HCT: 45.6 % (ref 36.0–46.0)
Hemoglobin: 15.3 g/dL — ABNORMAL HIGH (ref 12.0–15.0)
Immature Granulocytes: 0 %
Lymphocytes Relative: 16 %
Lymphs Abs: 1.3 10*3/uL (ref 0.7–4.0)
MCH: 30.2 pg (ref 26.0–34.0)
MCHC: 33.6 g/dL (ref 30.0–36.0)
MCV: 90.1 fL (ref 80.0–100.0)
Monocytes Absolute: 0.3 10*3/uL (ref 0.1–1.0)
Monocytes Relative: 4 %
Neutro Abs: 6.4 10*3/uL (ref 1.7–7.7)
Neutrophils Relative %: 80 %
Platelets: 279 10*3/uL (ref 150–400)
RBC: 5.06 MIL/uL (ref 3.87–5.11)
RDW: 12.6 % (ref 11.5–15.5)
WBC: 8 10*3/uL (ref 4.0–10.5)
nRBC: 0 % (ref 0.0–0.2)

## 2022-07-31 MED ORDER — FAMOTIDINE 20 MG PO TABS: 20.0000 mg | ORAL_TABLET | Freq: Two times a day (BID) | ORAL | 0 refills | Status: AC

## 2022-07-31 MED ORDER — BUPIVACAINE HCL 0.5 % IJ SOLN
20.0000 mL | Freq: Once | INTRAMUSCULAR | Status: AC
  Administered 2022-07-31: 10 mL
  Filled 2022-07-31: qty 1

## 2022-07-31 MED ORDER — ACETAMINOPHEN 500 MG PO TABS
1000.0000 mg | ORAL_TABLET | Freq: Once | ORAL | Status: AC
  Administered 2022-07-31: 1000 mg via ORAL
  Filled 2022-07-31: qty 2

## 2022-07-31 MED ORDER — METOCLOPRAMIDE HCL 5 MG/ML IJ SOLN: 10.0000 mg | Freq: Once | INTRAMUSCULAR | Status: DC

## 2022-07-31 MED ORDER — SODIUM CHLORIDE 0.9 % IV BOLUS: 1000.0000 mL | Freq: Once | INTRAVENOUS | Status: DC

## 2022-07-31 MED ORDER — DIPHENHYDRAMINE HCL 25 MG PO CAPS
25.0000 mg | ORAL_CAPSULE | Freq: Once | ORAL | Status: AC
  Administered 2022-07-31: 25 mg via ORAL
  Filled 2022-07-31: qty 1

## 2022-07-31 MED ORDER — DIPHENHYDRAMINE HCL 50 MG/ML IJ SOLN: 12.5000 mg | Freq: Once | INTRAMUSCULAR | Status: DC

## 2022-07-31 MED ORDER — ACETAMINOPHEN 500 MG PO TABS: 1000.0000 mg | ORAL_TABLET | Freq: Once | ORAL | Status: DC

## 2022-07-31 MED ORDER — METOCLOPRAMIDE HCL 5 MG/ML IJ SOLN
10.0000 mg | Freq: Once | INTRAMUSCULAR | Status: AC
  Administered 2022-07-31: 10 mg via INTRAVENOUS
  Filled 2022-07-31: qty 2

## 2022-07-31 MED ORDER — METOCLOPRAMIDE HCL 10 MG PO TABS: 10.0000 mg | ORAL_TABLET | Freq: Four times a day (QID) | ORAL | 0 refills | Status: DC

## 2022-07-31 MED ORDER — SODIUM CHLORIDE 0.9 % IV SOLN: INTRAVENOUS | Status: DC

## 2022-07-31 MED ORDER — KETOROLAC TROMETHAMINE 15 MG/ML IJ SOLN
15.0000 mg | Freq: Once | INTRAMUSCULAR | Status: AC
  Administered 2022-07-31: 15 mg via INTRAVENOUS
  Filled 2022-07-31: qty 1

## 2022-07-31 NOTE — ED Provider Triage Note (Signed)
Emergency Medicine Provider Triage Evaluation Note  JANICIA MONTERROSA , a 66 y.o. female  was evaluated in triage.  Pt complains of headache.  Patient reports being here earlier and having a nerve block done but the pain came right back so she came back.  No new symptoms  Review of Systems  Positive:  Negative:   Physical Exam  BP (!) 180/103   Pulse 94   Temp 97.8 F (36.6 C)   Resp 16   Ht '5\' 8"'$  (1.727 m)   Wt 83.5 kg   SpO2 97%   BMI 27.98 kg/m  Gen:   Awake, no distress   Resp:  Normal effort  MSK:   Moves extremities without difficulty  Other:    Medical Decision Making  Medically screening exam initiated at 4:10 PM.  Appropriate orders placed.  DEWANDA FENNEMA was informed that the remainder of the evaluation will be completed by another provider, this initial triage assessment does not replace that evaluation, and the importance of remaining in the ED until their evaluation is complete.     Rhae Hammock, PA-C 07/31/22 1611

## 2022-07-31 NOTE — ED Triage Notes (Signed)
Headache x 2 weeks that waxes and wanes and has been changing in nature. Recent parathyroid surgery for which she was told tension HA was a possible side effect.   Primarily occipital region.  Aleve helps. Heating pads help.   In triage BP 207/115 - "it gets like that when I get nervous"

## 2022-07-31 NOTE — ED Notes (Signed)
Pt c/o pain 4/10 in head immediately after nerve block. Originally was 8/10. Pt got a little lightheaded and nauseous. Ginger ale and cold rag given.

## 2022-07-31 NOTE — ED Notes (Signed)
Pt agreeable with d/c plan as discussed by provider- this nurse has verbally reinforced d/c instructions and provided pt with written copy - pt acknowledges verbal understanding and denies any addl questions, concerns, needs - pt ambulatory independently at d/c with steady gait; vitals stable; no distress.

## 2022-07-31 NOTE — ED Notes (Signed)
Pt states her pain has improved from original headache. Has 3/10 pain from old bruise from years ago.

## 2022-07-31 NOTE — ED Notes (Signed)
MD aware of pt's elevated BP. Pt to follow up with PCP Friday. Discharge education and paperwork given. Opportunities given for questions. Pt and significant other verbalized understanding.

## 2022-07-31 NOTE — Discharge Instructions (Addendum)
Your symptoms are consistent with occipital neuralgia and resolved with an occipital nerve block.

## 2022-07-31 NOTE — ED Provider Notes (Signed)
[place] EMERGENCY DEPT Provider Note   CSN: [id number] Arrival date & time: 07/31/22  1242     History  Chief Complaint  Patient presents with   headcahe    Melissa Miles is a 66 y.o. female.  66 year old female with a history of occipital neuralgia and hyper parathyroidism status post parathyroidectomy on 05/14/2022 who presents emergency department with headache.  Patient states that after her operation for her parathyroidectomy she occasionally has been having occipital headaches.  Worsened over the past 2 weeks.  Last night became severe so she came into the emergency department for evaluation.  Typically is sharp and located in her occiput.  Occasionally radiates towards her face but not recently.  States that last night it gradually built up and became more severe so she came into the emergency department.  She was treated with an occipital nerve block and went home but states that several hours after going home her pain became severe so she decided to come back to the emergency department for evaluation.  No fevers, neck stiffness, vision changes, numbness or weakness of her arms or legs, no proximal muscle weakness or pain, no jaw claudication.  Has tried taking Advil which does improve her symptoms somewhat.   Past Medical History:  Diagnosis Date   Anxiety    GERD (gastroesophageal reflux disease)    History of kidney stones    Hyperlipidemia    Hyperparathyroidism (HCC)    IBS (irritable bowel syndrome)    Dr Fuller Plan   Tubular adenoma of colon 03/2020      Home Medications Prior to Admission medications   Medication Sig Start Date End Date Taking? Authorizing Provider  famotidine (PEPCID) 20 MG tablet Take 1 tablet (20 mg total) by mouth 2 (two) times daily. 07/31/22  Yes Fransico Meadow, MD  metoCLOPramide (REGLAN) 10 MG tablet Take 1 tablet (10 mg total) by mouth every 6 (six) hours. 07/31/22  Yes Fransico Meadow, MD  aspirin 81 MG tablet Take  81 mg by mouth daily.    [provider]  atorvastatin (LIPITOR) 40 MG tablet Take 1 tablet (40 mg total) by mouth daily. 04/24/22   Midge Minium, MD  DULoxetine (CYMBALTA) 60 MG capsule Take 1 capsule by mouth once daily 06/22/22   Midge Minium, MD  gemfibrozil (LOPID) 600 MG tablet TAKE 1 TABLET BY MOUTH TWICE DAILY BEFORE MEAL(S) 06/07/22   Midge Minium, MD  Multiple Vitamins-Minerals (CENTRUM SILVER) CHEW Chew 1 tablet by mouth daily.    [provider]  traMADol (ULTRAM) 50 MG tablet Take 1-2 tablets (50-100 mg total) by mouth every 6 (six) hours as needed for moderate pain. 05/14/22   Armandina Gemma, MD      Allergies    Patient has no known allergies.    Review of Systems   Review of Systems  Physical Exam Updated Vital Signs BP (!) 183/97   Pulse 83   Temp 98.3 F (36.8 C) (Oral)   Resp 16   Ht '5\' 8"'$  (1.727 m)   Wt 83.5 kg   SpO2 99%   BMI 27.98 kg/m  Physical Exam Vitals and nursing note reviewed.  Constitutional:      General: She is not in acute distress.    Appearance: She is well-developed.  HENT:     Head: Normocephalic and atraumatic.     Right Ear: External ear normal.     Left Ear: External ear normal.     Nose:  Nose normal.  Eyes:     Extraocular Movements: Extraocular movements intact.     Conjunctiva/sclera: Conjunctivae normal.     Pupils: Pupils are equal, round, and reactive to light.  Neck:     Comments: No meningismus Pulmonary:     Effort: Pulmonary effort is normal. No respiratory distress.  Abdominal:     General: Abdomen is flat.  Musculoskeletal:        General: No swelling.     Cervical back: Normal range of motion and neck supple.  Skin:    General: Skin is warm and dry.     Capillary Refill: Capillary refill takes less than 2 seconds.  Neurological:     Mental Status: She is alert and oriented to person, place, and time. Mental status is at baseline.     Comments: MENTAL STATUS: AAOx3 CRANIAL  NERVES: II: Pupils equal and reactive 4 mm BL, no RAPD, no VF deficits III, IV, VI: EOM intact, no gaze preference or deviation, no nystagmus. V: normal sensation to light touch in V1, V2, and V3 segments bilaterally VII: no facial weakness or asymmetry, no nasolabial fold flattening VIII: normal hearing to speech and finger friction IX, X: normal palatal elevation, no uvular deviation XI: 5/5 head turn and 5/5 shoulder shrug bilaterally XII: midline tongue protrusion MOTOR: 5/5 strength in R shoulder flexion, elbow flexion and extension, and grip strength. 5/5 strength in L shoulder flexion, elbow flexion and extension, and grip strength.  5/5 strength in R hip and knee flexion, knee extension, ankle plantar and dorsiflexion. 5/5 strength in L hip and knee flexion, knee extension, ankle plantar and dorsiflexion. SENSORY: Normal sensation to light touch in all extremities COORD: Normal finger to nose and heel to shin, no tremor, no dysmetria STATION: normal stance, no truncal ataxia GAIT: Normal   Psychiatric:        Mood and Affect: Mood normal.     ED Results / Procedures / Treatments   Labs (all labs ordered are listed, but only abnormal results are displayed) Labs Reviewed  CBC WITH DIFFERENTIAL/PLATELET - Abnormal; Notable for the following components:      Result Value   Hemoglobin 15.3 (*)    All other components within normal limits  COMPREHENSIVE METABOLIC PANEL - Abnormal; Notable for the following components:   Glucose, Bld 127 (*)    Total Protein 8.5 (*)    Albumin 5.1 (*)    AST 51 (*)    ALT 65 (*)    All other components within normal limits  SARS CORONAVIRUS 2 BY RT PCR    EKG None  Radiology CT Head Wo Contrast  Result Date: 07/31/2022 CLINICAL DATA:  Head trauma, moderate to severe.  Headache 2 weeks. EXAM: CT HEAD WITHOUT CONTRAST TECHNIQUE: Contiguous axial images were obtained from the base of the skull through the vertex without intravenous  contrast. RADIATION DOSE REDUCTION: This exam was performed according to the departmental dose-optimization program which includes automated exposure control, adjustment of the mA and/or kV according to patient size and/or use of iterative reconstruction technique. COMPARISON:  None Available. FINDINGS: Brain: No evidence of acute infarction, hemorrhage, hydrocephalus, extra-axial collection or mass lesion/mass effect. Vascular: Negative for hyperdense vessel Skull: Negative Sinuses/Orbits: Retention cyst left maxillary sinus. Remaining sinuses clear. Normal orbit Other: None IMPRESSION: Negative CT head. Electronically Signed   By: Franchot Gallo M.D.   On: 07/31/2022 16:26    Procedures Procedures   Medications Ordered in ED Medications  metoCLOPramide (REGLAN) injection  10 mg (10 mg Intravenous Given 07/31/22 1909)  diphenhydrAMINE (BENADRYL) capsule 25 mg (25 mg Oral Given 07/31/22 1906)  ketorolac (TORADOL) 15 MG/ML injection 15 mg (15 mg Intravenous Given 07/31/22 1907)  acetaminophen (TYLENOL) tablet 1,000 mg (1,000 mg Oral Given 07/31/22 2026)    ED Course/ Medical Decision Making/ A&P                           Medical Decision Making Amount and/or Complexity of Data Reviewed Labs: ordered.  Risk OTC drugs. Prescription drug management.   Melissa Miles is a 66 y.o. female with comorbidities that complicate the patient evaluation including  occipital neuralgia and hyper parathyroidism status post parathyroidectomy on 05/14/2022 who presents emergency department with headache.   This patient presents to the ED for concern of complaints listed in HPI, this involves an extensive number of treatment options, and is a complaint that carries with it a high risk of complications and morbidity. Disposition including potential need for admission considered.   Initial Ddx:  Migraine, occipital neuralgia, SAH, ICH, temporal arteritis, meningitis, muscular pain  MDM:  Unclear what is  causing the patient's symptoms but given her history and nonfocal neurologic exam feel that life-threatening etiology of her headache is highly unlikely.  May be due to migraine or occipital neuralgia.  Given the gradual onset subarachnoid and ICH less likely.  Considered temporal arteritis but does not have any symptoms of this condition including vision changes, jaw claudication, or PMR.  No infectious symptoms or meningismus to suggest meningitis.  No injuries to suggest trapezius pain as a cause of her headache.  Plan:  Labs Migraine cocktail CT head  ED Summary/Re-evaluation:  Patient was reevaluated and her headache is gone from a 7/10 in severity to a 1/10 in severity after Toradol, Reglan, and Benadryl.  CT head did not reveal any acute abnormalities.  Was given a dose of Tylenol in the emergency department prior to discharge.  Was instructed to follow-up with her primary doctor as well as neurology about her headaches.  Was given a prescription for Reglan.  Also given a prescription for Pepcid and told to take if she was taking Advil for her headache ulcers.  Dispo: DC Home. Return precautions discussed including, but not limited to, those listed in the AVS. Allowed pt time to ask questions which were answered fully prior to dc.   Additional history obtained from spouse Records reviewed ED Visit Notes The following labs were independently interpreted: Chemistry and CBC and show no acute abnormality I independently reviewed the following imaging with scope of interpretation limited to determining acute life threatening conditions related to emergency care: CT Head, which revealed no acute abnormality  I personally reviewed and interpreted cardiac monitoring: normal sinus rhythm  I personally reviewed and interpreted the pt's EKG: see above for interpretation  I have reviewed the patients home medications and made adjustments as needed   Final Clinical Impression(s) / ED  Diagnoses Final diagnoses:  Nonintractable headache, unspecified chronicity pattern, unspecified headache type    Rx / DC Orders ED Discharge Orders          Ordered    metoCLOPramide (REGLAN) 10 MG tablet  Every 6 hours        07/31/22 2013    famotidine (PEPCID) 20 MG tablet  2 times daily        07/31/22 2013  Fransico Meadow, MD 08/01/22 [phone]

## 2022-07-31 NOTE — Discharge Instructions (Addendum)
Today you were seen in the emergency department for your headache.    In the emergency department you were given medications including Toradol, Tylenol, and Reglan which improved your symptoms.    At home, please take Tylenol and Reglan for your headache.  If you choose to take ibuprofen or Advil please take the Pepcid we have prescribed you to prevent an ulcer.    Check your MyChart online for the results of any tests that had not resulted by the time you left the emergency department.   Follow-up with your primary doctor in 2-3 days regarding your visit.  Follow-up with neurology as soon as possible about your symptoms.  Return immediately to the emergency department if you experience any of the following: Severe headache, neck stiffness, fever, vision changes, numbness or weakness of your arms or legs, or any other concerning symptoms.    Thank you for visiting our Emergency Department. It was a pleasure taking care of you today.

## 2022-07-31 NOTE — ED Provider Notes (Addendum)
[facility] EMERGENCY DEPT Provider Note   CSN: [id number] Arrival date & time: 07/31/22  0606     History  Chief Complaint  Patient presents with   Headache    Melissa Miles is a 66 y.o. female.   Headache    66 year old female with medical history significant for occipital neuralgia, hyperparathyroidism status post parathyroidectomy who presents to the emergency department with headache.  The patient states that she has had an occipital headache for the past 2 weeks that is waxed and waned.  She denies any fever or chills.  She denies any neck stiffness or rigidity.  She denies any blurry vision or otherwise vision changes.  She states that the headache starts in the right occiput and radiates to her right temple.  She has been trying heating pads, Aleve at home.  She denies any chest pain, shortness of breath or any other complaints or symptoms.  No numbness, weakness dysarthria, facial droop.  Home Medications Prior to Admission medications   Medication Sig Start Date End Date Taking? Authorizing Provider  aspirin 81 MG tablet Take 81 mg by mouth daily.    [provider]  atorvastatin (LIPITOR) 40 MG tablet Take 1 tablet (40 mg total) by mouth daily. 04/24/22   Midge Minium, MD  DULoxetine (CYMBALTA) 60 MG capsule Take 1 capsule by mouth once daily 06/22/22   Midge Minium, MD  gemfibrozil (LOPID) 600 MG tablet TAKE 1 TABLET BY MOUTH TWICE DAILY BEFORE MEAL(S) 06/07/22   Midge Minium, MD  Multiple Vitamins-Minerals (CENTRUM SILVER) CHEW Chew 1 tablet by mouth daily.    [provider]  traMADol (ULTRAM) 50 MG tablet Take 1-2 tablets (50-100 mg total) by mouth every 6 (six) hours as needed for moderate pain. 05/14/22   Armandina Gemma, MD      Allergies    Patient has no known allergies.    Review of Systems   Review of Systems  Neurological:  Positive for headaches.    Physical Exam Updated Vital Signs BP (!) 173/103    Pulse 98   Temp 97.7 F (36.5 C) (Oral)   Resp 18   Wt 83.5 kg   SpO2 97%   BMI 27.98 kg/m  Physical Exam Vitals and nursing note reviewed.  Constitutional:      General: She is not in acute distress.    Appearance: She is well-developed.  HENT:     Head: Normocephalic and atraumatic.  Eyes:     Conjunctiva/sclera: Conjunctivae normal.  Neck:     Comments: No nuchal rigidity Cardiovascular:     Rate and Rhythm: Normal rate and regular rhythm.  Pulmonary:     Effort: Pulmonary effort is normal. No respiratory distress.     Breath sounds: Normal breath sounds.  Abdominal:     Palpations: Abdomen is soft.     Tenderness: There is no abdominal tenderness.  Musculoskeletal:        General: No swelling.     Cervical back: Full passive range of motion without pain and neck supple. No rigidity.  Skin:    General: Skin is warm and dry.     Capillary Refill: Capillary refill takes less than 2 seconds.  Neurological:     Mental Status: She is alert.     Comments: MENTAL STATUS EXAM:    Orientation: Alert and oriented to person, place and time.  Memory: Cooperative, follows commands well.  Language: Speech is clear and language is normal.  CRANIAL NERVES:    CN 2 (Optic): Visual fields intact to confrontation.  CN 3,4,6 (EOM): Pupils equal and reactive to light. Full extraocular eye movement without nystagmus.  CN 5 (Trigeminal): Facial sensation is normal, no weakness of masticatory muscles.  CN 7 (Facial): No facial weakness or asymmetry.  CN 8 (Auditory): Auditory acuity grossly normal.  CN 9,10 (Glossophar): The uvula is midline, the palate elevates symmetrically.  CN 11 (spinal access): Normal sternocleidomastoid and trapezius strength.  CN 12 (Hypoglossal): The tongue is midline. No atrophy or fasciculations.Marland Kitchen   MOTOR:  Muscle Strength: 5/5RUE, 5/5LUE, 5/5RLE, 5/5LLE.   COORDINATION:   No tremor.   SENSATION:   Intact to light touch all four extremities.      Psychiatric:        Mood and Affect: Mood normal.     ED Results / Procedures / Treatments   Labs (all labs ordered are listed, but only abnormal results are displayed) Labs Reviewed - No data to display  EKG None  Radiology No results found.  Procedures .Nerve Block  Date/Time: 07/31/2022 8:13 AM  Performed by: Regan Lemming, MD Authorized by: Regan Lemming, MD   Consent:    Consent obtained:  Verbal   Consent given by:  Patient   Risks discussed:  Infection and unsuccessful block Universal protocol:    Patient identity confirmed:  Verbally with patient Indications:    Indications:  Pain relief Location:    Body area:  Head   Head nerve:  Occipital   Laterality:  Right Pre-procedure details:    Skin preparation:  Chlorhexidine   Preparation: Patient was prepped and draped in usual sterile fashion   Procedure details:    Block needle gauge:  25 G   Anesthetic injected:  Bupivacaine 0.25% w/o epi   Steroid injected:  None   Additive injected:  None   Injection procedure:  Anatomic landmarks identified, incremental injection, introduced needle, negative aspiration for blood and anatomic landmarks palpated   Paresthesia:  None Post-procedure details:    Dressing:  None   Outcome:  Pain relieved   Procedure completion:  Tolerated     Medications Ordered in ED Medications  bupivacaine (MARCAINE) 0.5 % (with pres) injection 20 mL (10 mLs Infiltration Given 07/31/22 0807)    ED Course/ Medical Decision Making/ A&P                           Medical Decision Making Risk Prescription drug management.    66 year old female with medical history significant for occipital neuralgia, hyperparathyroidism status post parathyroidectomy who presents to the emergency department with headache.  The patient states that she has had an occipital headache for the past 2 weeks that is waxed and waned.  She denies any fever or chills.  She denies any neck stiffness or  rigidity.  She denies any blurry vision or otherwise vision changes.  She states that the headache starts in the right occiput and radiates to her right temple.  She has been trying heating pads, Aleve at home.  She denies any chest pain, shortness of breath or any other complaints or symptoms.  No numbness, weakness dysarthria, facial droop.  On arrival, the patient was initially hypertensive, subsequently improved from 207 to 150 teen to 173/103 on recheck.  She is otherwise vitally stable, afebrile, not tachycardic or tachypneic.  Physical exam significant for normal neurologic exam.  Patient symptoms are consistent with likely occipital neuralgia.  Low  suspicion for meningitis/encephalitis, no nuchal rigidity on exam. Symptoms gradual in onset and intermittent over the past two weeks, low suspicion for ICH/SAH. An occipital nerve block was performed on the right as per the procedure note above with subsequent immediate resolution of the patient's headache and symptoms.  Symptoms are consistent with likely occipital neuralgia. BP elevated on arrival, down-trending without intervention to 167/109 and patient currently asymptomatic. Advised the patient follow-up with her PCP regarding this.  She is overall stable for discharge at this time.   Final Clinical Impression(s) / ED Diagnoses Final diagnoses:  Occipital neuralgia of right side    Rx / DC Orders ED Discharge Orders     None         Regan Lemming, MD 07/31/22 0459    Regan Lemming, MD 07/31/22 1715

## 2022-07-31 NOTE — ED Triage Notes (Signed)
Pt discharged this morning after being seen for a headache. Pt returned  stating that her headache returned and will not go away. Pt states that she has nausea, but no episodes of emesis. Pt denis blurry vision and denis balance issues.

## 2022-08-03 ENCOUNTER — Ambulatory Visit (INDEPENDENT_AMBULATORY_CARE_PROVIDER_SITE_OTHER): Payer: Medicare Other | Admitting: Family Medicine

## 2022-08-03 ENCOUNTER — Encounter: Payer: Self-pay | Admitting: Family Medicine

## 2022-08-03 VITALS — BP 130/88 | HR 103 | Temp 98.1°F | Resp 18 | Ht 68.0 in | Wt 195.2 lb

## 2022-08-03 DIAGNOSIS — M5481 Occipital neuralgia: Secondary | ICD-10-CM | POA: Diagnosis not present

## 2022-08-03 NOTE — Patient Instructions (Signed)
Follow up as needed or as scheduled We'll call you with your Neurology appt TAKE THE PAIN MEDS IF NEEDED Heat or ice- whichever feels better Call with any questions or concerns Hang in there!!!

## 2022-08-03 NOTE — Assessment & Plan Note (Signed)
New to provider, recurrent problem for pt.  Feels that this was triggered by her positioning during recent parathyroid surgery.  Urgent referral provided to Neurology upon pt request as she has previously required a nerve injection.  Told her to take the available pain medication as needed.  Pt expressed understanding and is in agreement w/ plan.

## 2022-08-03 NOTE — Progress Notes (Signed)
   Subjective:    Patient ID: Melissa Miles, female    DOB: [birth year], 66 y.o.   MRN: [id number]  HPI HA- pt has hx of occipital neuralgia and ice pick HA's.  Pt reports that since her parathyroid surgery and the way she was positioned she has had a recurrence of pain.  Pain was so severe on Monday that she went to the ER x2 on 11/7.  She had an occipital nerve block but this was ineffective.  CT was negative which reassured pt.     Review of Systems For ROS see HPI     Objective:   Physical Exam Vitals reviewed.  Constitutional:      General: She is not in acute distress.    Appearance: Normal appearance. She is not ill-appearing.  HENT:     Head: Normocephalic and atraumatic.  Eyes:     Extraocular Movements: Extraocular movements intact.     Conjunctiva/sclera: Conjunctivae normal.     Pupils: Pupils are equal, round, and reactive to light.  Musculoskeletal:     Cervical back: Normal range of motion. No rigidity.  Skin:    General: Skin is warm and dry.  Neurological:     General: No focal deficit present.     Mental Status: She is alert and oriented to person, place, and time.         Assessment & Plan:

## 2022-08-07 ENCOUNTER — Ambulatory Visit: Payer: Medicare Other | Admitting: Psychiatry

## 2022-08-07 VITALS — BP 173/101 | HR 93 | Ht 68.5 in | Wt 197.4 lb

## 2022-08-07 DIAGNOSIS — M5481 Occipital neuralgia: Secondary | ICD-10-CM | POA: Diagnosis not present

## 2022-08-07 MED ORDER — METHOCARBAMOL 500 MG PO TABS: 500.0000 mg | ORAL_TABLET | Freq: Every evening | ORAL | 0 refills | Status: DC | PRN

## 2022-08-07 NOTE — Progress Notes (Signed)
Referring:  Midge Minium, MD [address] [place],  [place] [address]  PCP: Pcp, No  Neurology was asked to evaluate Melissa Miles, a 66 year old female for a chief complaint of headaches.  Our recommendations of care will be communicated by shared medical record.    CC:  headaches  History provided from self  HPI:  Medical co-morbidities: hyperparathyroidism, HLD  The patient presents for evaluation of headaches which began after her parathyroid surgery in August 2023. Headache are described as sharp, lightning-like pain in her occiput which radiates forward. Sometimes it will radiate behind her eye. Pain lasts for seconds at a time. The back of her scalp will sometimes feel cold as well. Her R>L occiput will be tender to the touch.  The patient presented to the ED 07/31/22 where she received occipital nerve block, which helped temporarily (reduced pain from 8 to 4) but the pain returned soon after. CTH was unremarkable. Her headaches are no longer daily but still occur multiple times per week.  She does have a history of occipital neuralgia which previously responded to occipital nerve block. This has bothered her off and on throughout the years but typically would resolve with Tylenol or Advil.  Headache History: Onset: August 2023 Triggers: none Aura: none Location: occiput radiating forward Quality/Description: sharp, shooting, lightning Associated Symptoms:  Photophobia: yes  Phonophobia: no  Nausea: yes Worse with activity?: no Duration of headaches: seconds  Headache days per month: 15 Headache free days per month: 15  Current Treatment: Abortive Advil  Preventative none  Prior Therapies                                 Advil Tylenol Occipital nerve block   LABS: CBC    Component Value Date/Time   WBC 8.0 07/31/2022 1325   RBC 5.06 07/31/2022 1325   HGB 15.3 (H) 07/31/2022 1325   HCT 45.6 07/31/2022 1325   PLT 279 07/31/2022 1325   MCV  90.1 07/31/2022 1325   MCH 30.2 07/31/2022 1325   MCHC 33.6 07/31/2022 1325   RDW 12.6 07/31/2022 1325   LYMPHSABS 1.3 07/31/2022 1325   MONOABS 0.3 07/31/2022 1325   EOSABS 0.0 07/31/2022 1325   BASOSABS 0.0 07/31/2022 1325      Latest Ref Rng & Units 07/31/2022    1:25 PM 05/09/2022    9:48 AM 02/07/2022    2:50 PM  CMP  Glucose 70 - 99 mg/dL 127  116  86   BUN 8 - 23 mg/dL '17  22  18   '$ Creatinine 0.44 - 1.00 mg/dL 0.67  0.77  0.83   Sodium 135 - 145 mmol/L 137  142  138   Potassium 3.5 - 5.1 mmol/L 3.9  4.3  4.1   Chloride 98 - 111 mmol/L 101  109  102   CO2 22 - 32 mmol/L '24  23  26   '$ Calcium 8.9 - 10.3 mg/dL 10.1  10.5  11.5   Total Protein 6.5 - 8.1 g/dL 8.5  8.1  7.9   Total Bilirubin 0.3 - 1.2 mg/dL 0.6  0.8  0.6   Alkaline Phos 38 - 126 U/L 56  56  59   AST 15 - 41 U/L 51  50  54   ALT 0 - 44 U/L 65  53  57      IMAGING:  CT[facility] 07/31/22 unremarkable  Imaging  independently reviewed on August 07, 2022   Current Outpatient Medications on File Prior to Visit  Medication Sig Dispense Refill   aspirin 81 MG tablet Take 81 mg by mouth daily.     atorvastatin (LIPITOR) 40 MG tablet Take 1 tablet (40 mg total) by mouth daily. 90 tablet 1   DULoxetine (CYMBALTA) 60 MG capsule Take 1 capsule by mouth once daily 90 capsule 0   famotidine (PEPCID) 20 MG tablet Take 1 tablet (20 mg total) by mouth 2 (two) times daily. 30 tablet 0   gemfibrozil (LOPID) 600 MG tablet TAKE 1 TABLET BY MOUTH TWICE DAILY BEFORE MEAL(S) 180 tablet 0   metoCLOPramide (REGLAN) 10 MG tablet Take 10 mg by mouth 4 (four) times daily.     Multiple Vitamins-Minerals (CENTRUM SILVER) CHEW Chew 1 tablet by mouth daily.     No current facility-administered medications on file prior to visit.     Allergies: No Known Allergies  Family History: Migraine or other headaches in the family:  no Aneurysms in a first degree relative:  no Brain tumors in the family:  no Other neurological illness in the family:    no  Past Medical History: Past Medical History:  Diagnosis Date   Anxiety    GERD (gastroesophageal reflux disease)    History of kidney stones    Hyperlipidemia    Hyperparathyroidism (HCC)    IBS (irritable bowel syndrome)    Dr Fuller Plan   Tubular adenoma of colon 03/2020    Past Surgical History Past Surgical History:  Procedure Laterality Date   CERVICAL BIOPSY     X2 ; PMH abnormal PAP   CHOLECYSTECTOMY     CHOLECYSTECTOMY, LAPAROSCOPIC  2002   COLONOSCOPY      negative X 3   EXTRACORPOREAL SHOCK WAVE LITHOTRIPSY Left 11/13/2021   Procedure: EXTRACORPOREAL SHOCK WAVE LITHOTRIPSY (ESWL);  Surgeon: Alexis Frock, MD;  [facility];  Service: Urology;  Laterality: Left;  75 MINS   PARATHYROIDECTOMY N/A 05/14/2022   Procedure: NECK EXPLORATION WITH PARATHYROIDECTOMY;  Surgeon: Armandina Gemma, MD;  Location: [facility];  Service: General;  Laterality: N/A;   TUMOR REMOVAL     on buttocks    Social History: Social History   Tobacco Use   Smoking status: Never   Smokeless tobacco: Never  Vaping Use   Vaping Use: Never used  Substance Use Topics   Alcohol use: Yes    Comment: rarely- on beach   Drug use: No    ROS: Negative for fevers, chills. Positive for headaches. All other systems reviewed and negative unless stated otherwise in HPI.   Physical Exam:   Vital Signs: BP (!) 173/101   Pulse 93   Ht 5' 8.5" (1.74 m)   Wt 197 lb 6 oz (89.5 kg)   BMI 29.57 kg/m  GENERAL: well appearing,in no acute distress,alert SKIN:  Color, texture, turgor normal. No rashes or lesions HEAD:  Normocephalic/atraumatic. CV:  RRR RESP: Normal respiratory effort MSK: no tenderness to palpation over occiput, neck, or shoulders. Mildly limited cervical range of motion turning head left and right  NEUROLOGICAL: Mental Status: Alert, oriented to person, place and time,Follows commands Cranial Nerves: PERRL, visual fields intact to confrontation, extraocular  movements intact, facial sensation intact, no facial droop or ptosis, hearing grossly intact, no dysarthria Motor: muscle strength 5/5 both upper and lower extremities,no drift, normal tone Reflexes: 2+ throughout Sensation: intact to light touch all 4 extremities Coordination: Finger-to- nose-finger intact bilaterally Gait:  normal-based   IMPRESSION: 66 year old female with a history of hyperparathyroidism, HLD who presents for evaluation of headaches following her parathyroid surgery in August 2023. CTH was unremarkable and neurological exam today is normal. Her headache pattern is most consistent with occipital neuralgia. Suspect this may have been triggered by prolonged neck extension during her procedure. Will start Robaxin as needed for neck tension/muscle spasms. Discussed starting a daily preventive medication such as gabapentin if unable to control her headaches with PRN muscle relaxer alone.  PLAN: -Start Robaxin 500 mg PRN for muscle spasms/occipital neuralgia -Next steps: consider neck PT, gabapentin, repeat occipital nerve block (either without steroids or repeat with steroids in February)  I spent a total of 32 minutes chart reviewing and counseling the patient. Headache education was done. Discussed treatment options including preventive and acute medications, and physical therapy. Discussed medication side effects, adverse reactions and drug interactions. Written educational materials and patient instructions outlining all of the above were given.  Follow-up: 4 months   Genia Harold, MD 08/07/2022   2:34 PM

## 2022-08-15 ENCOUNTER — Ambulatory Visit (INDEPENDENT_AMBULATORY_CARE_PROVIDER_SITE_OTHER): Payer: Medicare Other | Admitting: Family Medicine

## 2022-08-15 ENCOUNTER — Encounter: Payer: Self-pay | Admitting: Family Medicine

## 2022-08-15 VITALS — BP 130/82 | HR 95 | Temp 98.6°F | Resp 18 | Ht 68.5 in | Wt 195.2 lb

## 2022-08-15 DIAGNOSIS — Z0000 Encounter for general adult medical examination without abnormal findings: Secondary | ICD-10-CM

## 2022-08-15 DIAGNOSIS — E785 Hyperlipidemia, unspecified: Secondary | ICD-10-CM | POA: Diagnosis not present

## 2022-08-15 NOTE — Assessment & Plan Note (Signed)
Repeat Calcium level as pt has not had labs since parathyroidectomy.

## 2022-08-15 NOTE — Patient Instructions (Signed)
Schedule a nurse visit to get your flu and Prevnar 20 Follow up w/ me in 6 months to recheck cholesterol We'll notify you of your lab results and make any changes if needed Call and schedule your mammogram Keep up the good work on healthy diet and regular exercise- you can do it! Call with any questions or concerns Stay Safe!  Stay Healthy! Happy Holidays!!!

## 2022-08-15 NOTE — Assessment & Plan Note (Signed)
Chronic problem.  Tolerating statin and Gemfibrozil w/o difficulty.  Check labs.  Adjust meds prn

## 2022-08-15 NOTE — Assessment & Plan Note (Signed)
Pt's PE WNL w/ exception of BMI.  UTD on pap, colonoscopy, Tdap.  Will get Flu and Prevnar after the holiday.  Due for mammo- pt to schedule.  Check labs.  Anticipatory guidance provided.

## 2022-08-15 NOTE — Progress Notes (Signed)
Subjective:    Patient ID: Melissa Miles, female    DOB: [birth year], 66 y.o.   MRN: [id number]  HPI Here today for CPE.  Risk Factors: Hyperlipidemia- chronic problem, on Lipitor '40mg'$  daily and Gemfibrozil '600mg'$  BID Physical Activity: no regular exercise Fall Risk: low Depression: ongoing issue, currently well controlled on Duloxetine '60mg'$  daily Hearing: normal to conversational tones and whispered voice at 6 ft ADL's: independent Cognitive: normal linear thought process, memory and attention intact Home Safety: safe at home, lives w/ spouse, Height, Weight, BMI, Visual Acuity: see vitals, vision checked today Counseling: due for mammo, UTD on Tdap, pap, colonoscopy, DEXA.  Due for flu and PNA- will wait until after the holidays Labs Ordered: See A&P Care Plan: See A&P   Low risk for substance misuse/addiction- doesn't smoke or drink or use controlled substances  Patient Care Team    Relationship Specialty Notifications Start End  Pcp, No PCP - General   07/31/22   Delila Pereyra, MD Consulting Physician Gynecology  07/20/15   Ladene Artist, MD Consulting Physician Gastroenterology  07/29/19     Health Maintenance  Topic Date Due   Medicare Annual Wellness (AWV)  Never done   MAMMOGRAM  03/23/2022   Zoster Vaccines- Shingrix (1 of 2) 11/03/2022 (Originally 04/06/2007)   INFLUENZA VACCINE  12/23/2022 (Originally 04/24/2022)   Pneumonia Vaccine 71+ Years old (1 - PCV) 08/04/2023 (Originally 04/05/2022)   PAP SMEAR-Modifier  11/27/2022   DEXA SCAN  03/24/2023   COLONOSCOPY (Pts 45-102yr Insurance coverage will need to be confirmed)  03/26/2027   Hepatitis C Screening  Completed   HIV Screening  Completed   HPV VACCINES  Aged Out   COVID-19 Vaccine  Discontinued      Review of Systems Patient reports no vision/ hearing changes, adenopathy,fever, weight change,  persistant/recurrent hoarseness , swallowing issues, chest pain, palpitations, edema, persistant/recurrent  cough, hemoptysis, dyspnea (rest/exertional/paroxysmal nocturnal), gastrointestinal bleeding (melena, rectal bleeding), abdominal pain, significant heartburn, bowel changes, GU symptoms (dysuria, hematuria, incontinence), Gyn symptoms (abnormal  bleeding, pain),  syncope, focal weakness, memory loss, numbness & tingling, skin/hair/nail changes, abnormal bruising or bleeding, anxiety, or depression.     Objective:   Physical Exam General Appearance:    Alert, cooperative, no distress, appears stated age  Head:    Normocephalic, without obvious abnormality, atraumatic  Eyes:    PERRL, conjunctiva/corneas clear, EOM's intact both eyes  Ears:    Normal TM's and external ear canals, both ears  Nose:   Nares normal, septum midline, mucosa normal, no drainage    or sinus tenderness  Throat:   Lips, mucosa, and tongue normal; teeth and gums normal  Neck:   Supple, symmetrical, trachea midline, no adenopathy;    Thyroid: no enlargement/tenderness/nodules  Back:     Symmetric, no curvature, ROM normal, no CVA tenderness  Lungs:     Clear to auscultation bilaterally, respirations unlabored  Chest Wall:    No tenderness or deformity   Heart:    Regular rate and rhythm, S1 and S2 normal, no murmur, rub   or gallop  Breast Exam:    Deferred to GYN  Abdomen:     Soft, non-tender, bowel sounds active all four quadrants,    no masses, no organomegaly  Genitalia:    Deferred to GYN  Rectal:    Extremities:   Extremities normal, atraumatic, no cyanosis or edema  Pulses:   2+ and symmetric all extremities  Skin:   Skin color, texture, turgor normal,  no rashes or lesions  Lymph nodes:   Cervical, supraclavicular, and axillary nodes normal  Neurologic:   CNII-XII intact, normal strength, sensation and reflexes    throughout          Assessment & Plan:

## 2022-08-16 LAB — CBC WITH DIFFERENTIAL/PLATELET
Absolute Monocytes: 517 cells/uL (ref 200–950)
Basophils Absolute: 38 cells/uL (ref 0–200)
Basophils Relative: 0.6 %
Eosinophils Absolute: 258 cells/uL (ref 15–500)
Eosinophils Relative: 4.1 %
HCT: 42.9 % (ref 35.0–45.0)
Hemoglobin: 14.6 g/dL (ref 11.7–15.5)
Lymphs Abs: 2268 cells/uL (ref 850–3900)
MCH: 29.9 pg (ref 27.0–33.0)
MCHC: 34 g/dL (ref 32.0–36.0)
MCV: 87.7 fL (ref 80.0–100.0)
MPV: 11.4 fL (ref 7.5–12.5)
Monocytes Relative: 8.2 %
Neutro Abs: 3219 cells/uL (ref 1500–7800)
Neutrophils Relative %: 51.1 %
Platelets: 271 10*3/uL (ref 140–400)
RBC: 4.89 10*6/uL (ref 3.80–5.10)
RDW: 12.1 % (ref 11.0–15.0)
Total Lymphocyte: 36 %
WBC: 6.3 10*3/uL (ref 3.8–10.8)

## 2022-08-16 LAB — HEPATIC FUNCTION PANEL
AG Ratio: 1.5 (calc) (ref 1.0–2.5)
ALT: 79 U/L — ABNORMAL HIGH (ref 6–29)
AST: 76 U/L — ABNORMAL HIGH (ref 10–35)
Albumin: 4.9 g/dL (ref 3.6–5.1)
Alkaline phosphatase (APISO): 59 U/L (ref 37–153)
Bilirubin, Direct: 0.1 mg/dL (ref 0.0–0.2)
Globulin: 3.2 g/dL (ref 1.9–3.7)
Indirect Bilirubin: 0.6 mg/dL (ref 0.2–1.2)
Total Bilirubin: 0.7 mg/dL (ref 0.2–1.2)
Total Protein: 8.1 g/dL (ref 6.1–8.1)

## 2022-08-16 LAB — LIPID PANEL
Cholesterol: 171 mg/dL (ref <200)
HDL: 41 mg/dL — ABNORMAL LOW (ref >=50)
LDL Cholesterol (Calc): 108 mg/dL (calc) — ABNORMAL HIGH
Non-HDL Cholesterol (Calc): 130 mg/dL (calc) — ABNORMAL HIGH (ref <130)
Total CHOL/HDL Ratio: 4.2 (calc) (ref <5.0)
Triglycerides: 112 mg/dL (ref <150)

## 2022-08-16 LAB — BASIC METABOLIC PANEL
BUN: 22 mg/dL (ref 7–25)
CO2: 26 mmol/L (ref 20–32)
Calcium: 10.5 mg/dL — ABNORMAL HIGH (ref 8.6–10.4)
Chloride: 102 mmol/L (ref 98–110)
Creat: 0.77 mg/dL (ref 0.50–1.05)
Glucose, Bld: 100 mg/dL — ABNORMAL HIGH (ref 65–99)
Potassium: 4.6 mmol/L (ref 3.5–5.3)
Sodium: 139 mmol/L (ref 135–146)

## 2022-08-16 LAB — VITAMIN D 25 HYDROXY (VIT D DEFICIENCY, FRACTURES): Vit D, 25-Hydroxy: 40 ng/mL (ref 30–100)

## 2022-08-16 LAB — TSH: TSH: 1.41 mIU/L (ref 0.40–4.50)

## 2022-08-20 ENCOUNTER — Telehealth: Payer: Self-pay

## 2022-08-20 ENCOUNTER — Other Ambulatory Visit: Payer: Self-pay

## 2022-08-20 ENCOUNTER — Telehealth: Payer: Self-pay | Admitting: Psychiatry

## 2022-08-20 ENCOUNTER — Ambulatory Visit: Payer: Medicare Other

## 2022-08-20 ENCOUNTER — Other Ambulatory Visit: Payer: Self-pay | Admitting: Psychiatry

## 2022-08-20 DIAGNOSIS — R7989 Other specified abnormal findings of blood chemistry: Secondary | ICD-10-CM

## 2022-08-20 DIAGNOSIS — M542 Cervicalgia: Secondary | ICD-10-CM

## 2022-08-20 MED ORDER — CYCLOBENZAPRINE HCL 5 MG PO TABS: 5.0000 mg | ORAL_TABLET | Freq: Three times a day (TID) | ORAL | 6 refills | Status: DC | PRN

## 2022-08-20 MED ORDER — GABAPENTIN 300 MG PO CAPS: 300.0000 mg | ORAL_CAPSULE | Freq: Every day | ORAL | 5 refills | Status: DC

## 2022-08-20 NOTE — Telephone Encounter (Signed)
-----   Message from Midge Minium, MD sent at 08/19/2022  1:28 PM EST ----- Labs look good w/ exception of mildly elevated liver enzymes.  They are higher than they were 6 months ago but lower than they were a year ago.  Please hold tylenol and alcohol x2 weeks and we will repeat your liver enzymes at a lab only visit (dx elevated LFTs)

## 2022-08-20 NOTE — Telephone Encounter (Signed)
Pt called back. I read her message from nurse Goodman. She said thank you.

## 2022-08-20 NOTE — Telephone Encounter (Signed)
Pt having severe headache pain from Occipital  Neuralgia. . Have taken methocarbamol (ROBAXIN) 500 MG tablet. Medication did not work. Did not sleep at all last night, eased up  this morning.  Would like a call from the to discuss an alterative.

## 2022-08-20 NOTE — Telephone Encounter (Signed)
I sent an rx for gabapentin and cyclobenzaprine to her pharmacy. She can start gabapentin daily at bedtime to help prevent the pain. If she does have breakthrough pain she can take the cyclobenzaprine up to 3 time a day as needed. I also placed a referral to physical therapy for her to help with her neck tension

## 2022-08-20 NOTE — Telephone Encounter (Signed)
Referral for physical therapy faxed  to [facility].  Phone: [phone], Fax: [phone]

## 2022-08-20 NOTE — Telephone Encounter (Signed)
Informed pt of lab results and repeat liver enzyme is in and pt has been scheduled for a lab only visit for 2 weeks

## 2022-08-20 NOTE — Telephone Encounter (Signed)
Pt returned call. She states yesterday around 2 pm she developed tension/tightness in posterior neck.she took a robaxin and she states she noted no benefit and as the day evening went on the pain and symptoms intensified. She said finally at bedtime she took another one to see if taking a 2nd one would help. She noted no relief and tossed and turned all night. She states that is has eased up today but when the "pain comes on its all of a sudden and it comes back with a vengeance."  The robaxin is written to take at bedtime as needed, but didn't note relief with it and also wanted to know what can be done when it comes on during the daytime.   The visit notes indicated next steps potentially trying PT for the neck and ? Gabapentin. Advised the patient I will let Dr Billey Gosling know what all is going on and get her thoughts and recommendation on what to do next. Pt verbalized understanding and was appreciative for the call back.

## 2022-08-20 NOTE — Telephone Encounter (Signed)
Called the patient. There was no answer. LVM for the pt to call back. Asked the patient to call back.   **When pt calls back let her know that Dr Billey Gosling send prescriptions into the pharmacy for her. Gabapentin will be a medication for her to take daily at bedtime to help prevent the pain. This can cause drowsiness so needs to be taken at bedtime. She also called in cyclobenzaprine 5 mg for her to take up to 3 times a day as needed. This may help if she has break through pain. She has also placed the referral for physical therapy to help with neck tension.

## 2022-08-20 NOTE — Telephone Encounter (Signed)
Called the patient back. There was no answer. Left a VM advising we received her message and was calling her back. Instructed her to call back.

## 2022-09-03 ENCOUNTER — Ambulatory Visit (INDEPENDENT_AMBULATORY_CARE_PROVIDER_SITE_OTHER): Payer: Medicare Other | Admitting: Family Medicine

## 2022-09-03 ENCOUNTER — Other Ambulatory Visit (INDEPENDENT_AMBULATORY_CARE_PROVIDER_SITE_OTHER): Payer: Medicare Other

## 2022-09-03 DIAGNOSIS — M5481 Occipital neuralgia: Secondary | ICD-10-CM | POA: Diagnosis not present

## 2022-09-03 DIAGNOSIS — Z23 Encounter for immunization: Secondary | ICD-10-CM

## 2022-09-03 DIAGNOSIS — R7989 Other specified abnormal findings of blood chemistry: Secondary | ICD-10-CM

## 2022-09-03 DIAGNOSIS — M6281 Muscle weakness (generalized): Secondary | ICD-10-CM | POA: Diagnosis not present

## 2022-09-03 DIAGNOSIS — M542 Cervicalgia: Secondary | ICD-10-CM | POA: Diagnosis not present

## 2022-09-03 LAB — HEPATIC FUNCTION PANEL
ALT: 62 U/L — ABNORMAL HIGH (ref 0–35)
AST: 65 U/L — ABNORMAL HIGH (ref 0–37)
Albumin: 4.9 g/dL (ref 3.5–5.2)
Alkaline Phosphatase: 56 U/L (ref 39–117)
Bilirubin, Direct: 0.1 mg/dL (ref 0.0–0.3)
Total Bilirubin: 0.7 mg/dL (ref 0.2–1.2)
Total Protein: 8 g/dL (ref 6.0–8.3)

## 2022-09-03 NOTE — Progress Notes (Signed)
Pt came in for her Flu Vaccine and Prevnar 20 vaccine  Tolerated injections well  Flu given in left deltoid and Prevnar 20 given in right deltoid

## 2022-09-04 ENCOUNTER — Telehealth: Payer: Self-pay

## 2022-09-04 NOTE — Telephone Encounter (Signed)
-----   Message from Midge Minium, MD sent at 09/04/2022  7:42 AM EST ----- Your liver enzymes are trending down- this is good news!!

## 2022-09-04 NOTE — Telephone Encounter (Signed)
Informed pt of lab results  

## 2022-09-12 ENCOUNTER — Other Ambulatory Visit: Payer: Self-pay | Admitting: Family Medicine

## 2022-09-12 DIAGNOSIS — E785 Hyperlipidemia, unspecified: Secondary | ICD-10-CM

## 2022-09-16 ENCOUNTER — Other Ambulatory Visit: Payer: Self-pay | Admitting: Family Medicine

## 2022-09-21 DIAGNOSIS — Z1231 Encounter for screening mammogram for malignant neoplasm of breast: Secondary | ICD-10-CM | POA: Diagnosis not present

## 2022-09-21 DIAGNOSIS — Z01419 Encounter for gynecological examination (general) (routine) without abnormal findings: Secondary | ICD-10-CM | POA: Diagnosis not present

## 2022-09-21 DIAGNOSIS — N952 Postmenopausal atrophic vaginitis: Secondary | ICD-10-CM | POA: Diagnosis not present

## 2022-09-21 DIAGNOSIS — Z6830 Body mass index (BMI) 30.0-30.9, adult: Secondary | ICD-10-CM | POA: Diagnosis not present

## 2022-09-21 LAB — HM MAMMOGRAPHY

## 2022-09-25 ENCOUNTER — Ambulatory Visit (INDEPENDENT_AMBULATORY_CARE_PROVIDER_SITE_OTHER): Payer: Medicare Other | Admitting: Family

## 2022-09-25 ENCOUNTER — Encounter: Payer: Self-pay | Admitting: Family

## 2022-09-25 VITALS — BP 166/100 | HR 96 | Temp 98.5°F | Ht 68.5 in | Wt 197.2 lb

## 2022-09-25 DIAGNOSIS — I10 Essential (primary) hypertension: Secondary | ICD-10-CM

## 2022-09-25 MED ORDER — LOSARTAN POTASSIUM-HCTZ 50-12.5 MG PO TABS: 1.0000 | ORAL_TABLET | Freq: Every day | ORAL | 1 refills | Status: DC

## 2022-09-26 NOTE — Progress Notes (Signed)
Acute Office Visit  Subjective:     Patient ID: Melissa Miles, female    DOB: 11-May-1956, 67 y.o.   MRN: [id number]  Chief Complaint  Patient presents with   Hypertension    Pt states she has been keeping a log on her BP and has been running high     HPI Patient is in today with concerns of elevated blood pressure. She has been checking her blood pressure periodically over the last 2 months and is seeing readings consistently in the 675-916B systolic. She denies any symptoms but is becoming increasingly more concerned.   Review of Systems  Cardiovascular:        Elevated blood pressure  All other systems reviewed and are negative.   Past Medical History:  Diagnosis Date   Anxiety    GERD (gastroesophageal reflux disease)    History of kidney stones    Hyperlipidemia    Hyperparathyroidism (HCC)    IBS (irritable bowel syndrome)    Dr Fuller Plan   Tubular adenoma of colon 03/2020    Social History   Socioeconomic History   Marital status: Married    Spouse name: Not on file   Number of children: Not on file   Years of education: Not on file   Highest education level: Not on file  Occupational History   Not on file  Tobacco Use   Smoking status: Never   Smokeless tobacco: Never  Vaping Use   Vaping Use: Never used  Substance and Sexual Activity   Alcohol use: Yes    Comment: rarely- on beach   Drug use: No   Sexual activity: Not on file  Other Topics Concern   Not on file  Social History Narrative   Not on file   Social Determinants of Health   Financial Resource Strain: Not on file  Food Insecurity: Not on file  Transportation Needs: Not on file  Physical Activity: Not on file  Stress: Not on file  Social Connections: Not on file  Intimate Partner Violence: Not on file    Past Surgical History:  Procedure Laterality Date   CERVICAL BIOPSY     X2 ; PMH abnormal PAP   CHOLECYSTECTOMY     CHOLECYSTECTOMY, LAPAROSCOPIC  2002   COLONOSCOPY       negative X 3   EXTRACORPOREAL SHOCK WAVE LITHOTRIPSY Left 11/13/2021   Procedure: EXTRACORPOREAL SHOCK WAVE LITHOTRIPSY (ESWL);  Surgeon: Alexis Frock, MD;  Location: [facility];  Service: Urology;  Laterality: Left;  81 MINS   PARATHYROIDECTOMY N/A 05/14/2022   Procedure: NECK EXPLORATION WITH PARATHYROIDECTOMY;  Surgeon: Armandina Gemma, MD;  Location: [facility];  Service: General;  Laterality: N/A;   TUMOR REMOVAL     on buttocks    Family History  Problem Relation Age of Onset   Mental illness Other        half sister had  ? bipolar   Heart attack Other 80       half bro    Cancer Father        NHL   Osteoporosis Mother    Thyroid disease Other        half bro , hypothyroidism   Hypercalcemia Neg Hx    Colon cancer Neg Hx    Esophageal cancer Neg Hx    Rectal cancer Neg Hx    Stomach cancer Neg Hx     No Known Allergies  Current Outpatient Medications on File Prior to Visit  Medication Sig Dispense Refill   aspirin 81 MG tablet Take 81 mg by mouth daily.     atorvastatin (LIPITOR) 40 MG tablet Take 1 tablet (40 mg total) by mouth daily. 90 tablet 1   DULoxetine (CYMBALTA) 60 MG capsule Take 1 capsule by mouth once daily 90 capsule 0   famotidine (PEPCID) 20 MG tablet Take 1 tablet (20 mg total) by mouth 2 (two) times daily. 30 tablet 0   gemfibrozil (LOPID) 600 MG tablet TAKE 1 TABLET BY MOUTH TWICE DAILY BEFORE MEAL(S) 180 tablet 0   cyclobenzaprine (FLEXERIL) 5 MG tablet Take 1 tablet (5 mg total) by mouth every 8 (eight) hours as needed for muscle spasms. (Patient not taking: Reported on 09/25/2022) 30 tablet 6   gabapentin (NEURONTIN) 300 MG capsule Take 1 capsule (300 mg total) by mouth at bedtime. (Patient not taking: Reported on 09/25/2022) 30 capsule 5   No current facility-administered medications on file prior to visit.    BP (!) 166/100   Pulse 96   Temp 98.5 F (36.9 C)   Ht 5' 8.5" (1.74 m)   Wt 197 lb 3.2 oz (89.4 kg)   SpO2 97%   BMI 29.55  kg/m chart     Objective:    BP (!) 166/100   Pulse 96   Temp 98.5 F (36.9 C)   Ht 5' 8.5" (1.74 m)   Wt 197 lb 3.2 oz (89.4 kg)   SpO2 97%   BMI 29.55 kg/m    Physical Exam Vitals and nursing note reviewed.  Constitutional:      Appearance: Normal appearance. She is normal weight.  HENT:     Mouth/Throat:     Mouth: Mucous membranes are moist.  Cardiovascular:     Rate and Rhythm: Normal rate and regular rhythm.     Comments: Recheck BP: 164/98 Pulmonary:     Effort: Pulmonary effort is normal.     Breath sounds: Normal breath sounds.  Abdominal:     General: Bowel sounds are normal.     Palpations: Abdomen is soft.  Musculoskeletal:        General: Normal range of motion.  Skin:    General: Skin is warm and dry.  Neurological:     General: No focal deficit present.     Mental Status: She is alert and oriented to person, place, and time.     No results found for any visits on 09/25/22.      Assessment & Plan:   Problem List Items Addressed This Visit   None Visit Diagnoses     Hypertension, essential    -  Primary   Relevant Medications   losartan-hydrochlorothiazide (HYZAAR) 50-12.5 MG tablet       Meds ordered this encounter  Medications   losartan-hydrochlorothiazide (HYZAAR) 50-12.5 MG tablet    Sig: Take 1 tablet by mouth daily.    Dispense:  30 tablet    Refill:  1    Order Specific Question:   Supervising Provider    Answer:   Mosie Lukes [id number]   Call the office with any questions or concerns. Recheck as scheduled and sooner as needed.  Return in about 3 weeks (around 10/16/2022).  Kennyth Arnold, FNP

## 2022-10-01 DIAGNOSIS — M6281 Muscle weakness (generalized): Secondary | ICD-10-CM | POA: Diagnosis not present

## 2022-10-01 DIAGNOSIS — M542 Cervicalgia: Secondary | ICD-10-CM | POA: Diagnosis not present

## 2022-10-01 DIAGNOSIS — M5481 Occipital neuralgia: Secondary | ICD-10-CM | POA: Diagnosis not present

## 2022-10-15 DIAGNOSIS — M6281 Muscle weakness (generalized): Secondary | ICD-10-CM | POA: Diagnosis not present

## 2022-10-15 DIAGNOSIS — M542 Cervicalgia: Secondary | ICD-10-CM | POA: Diagnosis not present

## 2022-10-15 DIAGNOSIS — M5481 Occipital neuralgia: Secondary | ICD-10-CM | POA: Diagnosis not present

## 2022-10-18 ENCOUNTER — Ambulatory Visit (INDEPENDENT_AMBULATORY_CARE_PROVIDER_SITE_OTHER): Payer: Medicare Other | Admitting: Family Medicine

## 2022-10-18 ENCOUNTER — Encounter: Payer: Self-pay | Admitting: Family Medicine

## 2022-10-18 VITALS — BP 120/82 | HR 115 | Temp 98.8°F | Resp 18 | Ht 68.5 in | Wt 200.2 lb

## 2022-10-18 DIAGNOSIS — I10 Essential (primary) hypertension: Secondary | ICD-10-CM

## 2022-10-18 NOTE — Assessment & Plan Note (Signed)
New.  Pt recently had a run of elevated BP at other offices.  She was started on Losartan HCTZ 50/12.'5mg'$  on 1/2 and BP's are much improved.  She is asymptomatic.  Check BMP due to ARB and diuretic, but no anticipated med changes.  Will follow.

## 2022-10-18 NOTE — Patient Instructions (Signed)
Follow up as needed or as scheduled We'll notify you of your lab results and make any changes if needed Limit your salt intake, increase your water intake, try and get regular physical activity Continue the Losartan HCTZ daily- your blood pressure looks great!! Call with any questions or concerns Stay Safe!  Stay Healthy! Happy New Year!!!

## 2022-10-18 NOTE — Progress Notes (Signed)
   Subjective:    Patient ID: Melissa Miles, female    DOB: 02-Jul-1956, 67 y.o.   MRN: [id number]  HPI HTN- Pt saw Dutch Quint FNP on 09/25/22 and BP was 166/100.  She was started on Losartan HCTZ 50/12.'5mg'$  daily.  BP today is well controlled.  'i feel perfectly fine'.  Denies CP, SOB, HA's, visual changes, dizziness, edema.   Review of Systems For ROS see HPI     Objective:   Physical Exam Vitals reviewed.  Constitutional:      General: She is not in acute distress.    Appearance: Normal appearance. She is well-developed. She is not ill-appearing.  HENT:     Head: Normocephalic and atraumatic.  Eyes:     Conjunctiva/sclera: Conjunctivae normal.     Pupils: Pupils are equal, round, and reactive to light.  Neck:     Thyroid: No thyromegaly.  Cardiovascular:     Rate and Rhythm: Normal rate and regular rhythm.     Pulses: Normal pulses.     Heart sounds: Normal heart sounds. No murmur heard. Pulmonary:     Effort: Pulmonary effort is normal. No respiratory distress.     Breath sounds: Normal breath sounds.  Abdominal:     General: There is no distension.     Palpations: Abdomen is soft.     Tenderness: There is no abdominal tenderness.  Musculoskeletal:     Cervical back: Normal range of motion and neck supple.     Right lower leg: No edema.     Left lower leg: No edema.  Lymphadenopathy:     Cervical: No cervical adenopathy.  Skin:    General: Skin is warm and dry.  Neurological:     Mental Status: She is alert and oriented to person, place, and time.  Psychiatric:        Behavior: Behavior normal.           Assessment & Plan:

## 2022-10-19 ENCOUNTER — Telehealth: Payer: Self-pay

## 2022-10-19 LAB — BASIC METABOLIC PANEL
BUN: 22 mg/dL (ref 6–23)
CO2: 26 mEq/L (ref 19–32)
Calcium: 10.2 mg/dL (ref 8.4–10.5)
Chloride: 102 mEq/L (ref 96–112)
Creatinine, Ser: 0.79 mg/dL (ref 0.40–1.20)
GFR: 78.43 mL/min (ref >60.00)
Glucose, Bld: 116 mg/dL — ABNORMAL HIGH (ref 70–99)
Potassium: 3.9 mEq/L (ref 3.5–5.1)
Sodium: 137 mEq/L (ref 135–145)

## 2022-10-19 NOTE — Telephone Encounter (Signed)
Informed pt of lab results  

## 2022-10-19 NOTE — Telephone Encounter (Signed)
-----  Message from Midge Minium, MD sent at 10/19/2022 12:32 PM EST ----- Labs look great!  No med changes at this time

## 2022-10-22 DIAGNOSIS — M542 Cervicalgia: Secondary | ICD-10-CM | POA: Diagnosis not present

## 2022-10-22 DIAGNOSIS — M5481 Occipital neuralgia: Secondary | ICD-10-CM | POA: Diagnosis not present

## 2022-10-22 DIAGNOSIS — M6281 Muscle weakness (generalized): Secondary | ICD-10-CM | POA: Diagnosis not present

## 2022-10-25 ENCOUNTER — Encounter: Payer: Self-pay | Admitting: Family Medicine

## 2022-11-06 ENCOUNTER — Other Ambulatory Visit: Payer: Self-pay | Admitting: Family Medicine

## 2022-11-06 DIAGNOSIS — E785 Hyperlipidemia, unspecified: Secondary | ICD-10-CM

## 2022-11-12 ENCOUNTER — Other Ambulatory Visit: Payer: Self-pay | Admitting: Family

## 2022-11-20 ENCOUNTER — Other Ambulatory Visit: Payer: Self-pay

## 2022-11-20 DIAGNOSIS — I10 Essential (primary) hypertension: Secondary | ICD-10-CM

## 2022-11-20 MED ORDER — LOSARTAN POTASSIUM-HCTZ 50-12.5 MG PO TABS: 1.0000 | ORAL_TABLET | Freq: Every day | ORAL | 1 refills | Status: DC

## 2022-12-06 NOTE — Progress Notes (Signed)
Referring:  No referring provider defined for this encounter.  PCP: Midge Minium, MD   CC:  headaches Chief Complaint  Patient presents with   Follow-up    Patient in room #3 and alone. Patient states taking physical therapy has done well for her headaches.      History provided from self  Follow-up visit:  Prior visit: 08/07/2022 with Dr. Billey Gosling (initial consult visit)  Brief HPI:   Melissa Miles is a 67 y.o. female who is being seen for follow-up regarding headaches following parathyroid surgery in 04/2022. CTH normal. Felt headache pattern most consistent with occipital neuralgia and triggered by prolonged neck extension during procedure.  At prior visit, was started on Robaxin as needed. Patient called office shortly after due to no benefit with Robaxin therefore was started on gabapentin and Flexeril and referred to PT for cervicalgia.   Interval history:  Patient significant improvement of headaches and cervicalgia after working with PT. Since completion, denies any additional headaches or pain. She has not been doing exercises routinely at home. Flexeril and gabapentin not needed. No questions or concerns today.      Headache days per month: 0 Headache free days per month: 30  Current Treatment: Abortive none  Preventative none  Prior Therapies                                 Advil Tylenol Occipital nerve block  Robaxin  LABS: CBC    Component Value Date/Time   WBC 6.3 08/15/2022 1430   RBC 4.89 08/15/2022 1430   HGB 14.6 08/15/2022 1430   HCT 42.9 08/15/2022 1430   PLT 271 08/15/2022 1430   MCV 87.7 08/15/2022 1430   MCH 29.9 08/15/2022 1430   MCHC 34.0 08/15/2022 1430   RDW 12.1 08/15/2022 1430   LYMPHSABS 2,268 08/15/2022 1430   MONOABS 0.3 07/31/2022 1325   EOSABS 258 08/15/2022 1430   BASOSABS 38 08/15/2022 1430      Latest Ref Rng & Units 10/18/2022    2:40 PM 09/03/2022    2:06 PM 08/15/2022    2:30 PM  CMP  Glucose  70 - 99 mg/dL 116   100   BUN 6 - 23 mg/dL 22   22   Creatinine 0.40 - 1.20 mg/dL 0.79   0.77   Sodium 135 - 145 mEq/L 137   139   Potassium 3.5 - 5.1 mEq/L 3.9   4.6   Chloride 96 - 112 mEq/L 102   102   CO2 19 - 32 mEq/L 26   26   Calcium 8.4 - 10.5 mg/dL 10.2   10.5   Total Protein 6.0 - 8.3 g/dL  8.0  8.1   Total Bilirubin 0.2 - 1.2 mg/dL  0.7  0.7   Alkaline Phos 39 - 117 U/L  56    AST 0 - 37 U/L  65  76   ALT 0 - 35 U/L  62  79      IMAGING:  [place] 07/31/22 unremarkable  Imaging independently reviewed on December 10, 2022   Current Outpatient Medications on File Prior to Visit  Medication Sig Dispense Refill   aspirin 81 MG tablet Take 81 mg by mouth daily.     atorvastatin (LIPITOR) 40 MG tablet Take 1 tablet (40 mg total) by mouth daily. 90 tablet 1   DULoxetine (CYMBALTA) 60 MG capsule Take 1 capsule by mouth once  daily 90 capsule 0   famotidine (PEPCID) 20 MG tablet Take 1 tablet (20 mg total) by mouth 2 (two) times daily. 30 tablet 0   gemfibrozil (LOPID) 600 MG tablet TAKE 1 TABLET BY MOUTH TWICE DAILY BEFORE MEAL(S) 180 tablet 0   losartan-hydrochlorothiazide (HYZAAR) 50-12.5 MG tablet Take 1 tablet by mouth daily. 30 tablet 1   No current facility-administered medications on file prior to visit.     Allergies: No Known Allergies  Family History: Migraine or other headaches in the family:  no Aneurysms in a first degree relative:  no Brain tumors in the family:  no Other neurological illness in the family:   no  Past Medical History: Past Medical History:  Diagnosis Date   Anxiety    GERD (gastroesophageal reflux disease)    History of kidney stones    Hyperlipidemia    Hyperparathyroidism (HCC)    IBS (irritable bowel syndrome)    Dr Fuller Plan   Tubular adenoma of colon 03/2020    Past Surgical History Past Surgical History:  Procedure Laterality Date   CERVICAL BIOPSY     X2 ; PMH abnormal PAP   CHOLECYSTECTOMY     CHOLECYSTECTOMY, LAPAROSCOPIC   2002   COLONOSCOPY      negative X 3   EXTRACORPOREAL SHOCK WAVE LITHOTRIPSY Left 11/13/2021   Procedure: EXTRACORPOREAL SHOCK WAVE LITHOTRIPSY (ESWL);  Surgeon: Alexis Frock, MD;  Location: [facility];  Service: Urology;  Laterality: Left;  75 MINS   PARATHYROIDECTOMY N/A 05/14/2022   Procedure: NECK EXPLORATION WITH PARATHYROIDECTOMY;  Surgeon: Armandina Gemma, MD;  Location: [facility];  Service: General;  Laterality: N/A;   TUMOR REMOVAL     on buttocks    Social History: Social History   Tobacco Use   Smoking status: Never   Smokeless tobacco: Never  Vaping Use   Vaping Use: Never used  Substance Use Topics   Alcohol use: Yes    Comment: rarely- on beach   Drug use: No    ROS: Negative for fevers, chills. Positive for headaches. All other systems reviewed and negative unless stated otherwise in HPI.   Physical Exam:   Vital Signs: BP 114/69 (BP Location: Right Arm, Patient Position: Sitting, Cuff Size: Normal)   Pulse (!) 112   Ht 5\' 8"  (1.727 m)   Wt 205 lb 12.8 oz (93.4 kg)   BMI 31.29 kg/m  GENERAL: well appearing,in no acute distress,alert SKIN:  Color, texture, turgor normal. No rashes or lesions HEAD:  Normocephalic/atraumatic. CV:  RRR RESP: Normal respiratory effort  NEUROLOGICAL: Mental Status: Alert, oriented to person, place and time,Follows commands Cranial Nerves: PERRL, visual fields intact to confrontation, extraocular movements intact, facial sensation intact, no facial droop or ptosis, hearing grossly intact, no dysarthria Motor: muscle strength 5/5 both upper and lower extremities,no drift, normal tone Reflexes: 2+ throughout Sensation: intact to light touch all 4 extremities Coordination: Finger-to- nose-finger intact bilaterally Gait: normal-based   IMPRESSION: 67 year old female with a history of hyperparathyroidism, HLD who presents for evaluation of headaches following her parathyroid surgery in August 2023, symptoms most  consistent with occipital neuralgia likely triggered by prolonged neck extension during procedure.  Reports significant improvement of symptoms after working with PT, no additional pain or headaches since completing PT.  No medication management needed.   PLAN: -recommend continued HEP as advised during therapy sessions -Advised to call with any recurrent symptoms otherwise can follow-up as needed    I spent 16 minutes of  face-to-face and non-face-to-face time with patient.  This included previsit chart review, lab review, study review, order entry, electronic health record documentation, patient education and discussion regarding the above and answered all other questions to patient's satisfaction  Frann Rider, [facility]  [facility] [address] [place], [place] [address]  Phone [phone] Fax [phone] Note: This document was prepared with digital dictation and possible smart phrase technology. Any transcriptional errors that result from this process are unintentional.

## 2022-12-10 ENCOUNTER — Encounter: Payer: Self-pay | Admitting: Adult Health

## 2022-12-10 ENCOUNTER — Ambulatory Visit: Payer: Medicare Other | Admitting: Adult Health

## 2022-12-10 VITALS — BP 114/69 | HR 112 | Ht 68.0 in | Wt 205.8 lb

## 2022-12-10 DIAGNOSIS — M5481 Occipital neuralgia: Secondary | ICD-10-CM | POA: Diagnosis not present

## 2022-12-10 NOTE — Patient Instructions (Addendum)
Your Plan:  Restart doing neck exercises at home to prevent any reoccurrence      Follow up as needed at this time - please call with any recurrent symptoms      Thank you for coming to see [place] at [facility]. I hope we have been able to provide you high quality care today.  You may receive a patient satisfaction survey over the next few weeks. We would appreciate your feedback and comments so that we may continue to improve ourselves and the health of our patients.

## 2022-12-14 ENCOUNTER — Other Ambulatory Visit: Payer: Self-pay | Admitting: Family Medicine

## 2023-01-11 ENCOUNTER — Other Ambulatory Visit: Payer: Self-pay | Admitting: Family Medicine

## 2023-01-11 DIAGNOSIS — I10 Essential (primary) hypertension: Secondary | ICD-10-CM

## 2023-02-08 ENCOUNTER — Other Ambulatory Visit: Payer: Self-pay | Admitting: Pharmacist

## 2023-02-08 NOTE — Progress Notes (Signed)
Pharmacy Quality Measure Review  This patient is appearing on the insurance-provided list for being at risk of failing the adherence measure for hypertension (ACEi/ARB) medications this calendar year.   Medication: losartan HCTZ Last fill date: 12/16/2022 per Epic records but Rx was sent to her pharmacy for 30 days no RF on 01/14/2023.   Patient has appt with PCP 02/13/2023 - place note in appointment notes to consider Rx for 90 day supply if appropriate.   Henrene Pastor, PharmD Clinical Pharmacist [facility]

## 2023-02-09 ENCOUNTER — Other Ambulatory Visit: Payer: Self-pay | Admitting: Family Medicine

## 2023-02-09 DIAGNOSIS — I10 Essential (primary) hypertension: Secondary | ICD-10-CM

## 2023-02-13 ENCOUNTER — Encounter: Payer: Self-pay | Admitting: Family Medicine

## 2023-02-13 ENCOUNTER — Ambulatory Visit (INDEPENDENT_AMBULATORY_CARE_PROVIDER_SITE_OTHER): Payer: Medicare Other | Admitting: Family Medicine

## 2023-02-13 VITALS — BP 130/80 | HR 105 | Temp 98.0°F | Resp 17 | Ht 68.0 in | Wt 203.2 lb

## 2023-02-13 DIAGNOSIS — I10 Essential (primary) hypertension: Secondary | ICD-10-CM | POA: Diagnosis not present

## 2023-02-13 DIAGNOSIS — E785 Hyperlipidemia, unspecified: Secondary | ICD-10-CM

## 2023-02-13 DIAGNOSIS — Z6830 Body mass index (BMI) 30.0-30.9, adult: Secondary | ICD-10-CM | POA: Diagnosis not present

## 2023-02-13 DIAGNOSIS — E669 Obesity, unspecified: Secondary | ICD-10-CM

## 2023-02-13 LAB — CBC WITH DIFFERENTIAL/PLATELET
Basophils Absolute: 0 10*3/uL (ref 0.0–0.1)
Basophils Relative: 0.4 % (ref 0.0–3.0)
Eosinophils Absolute: 0.2 10*3/uL (ref 0.0–0.7)
Eosinophils Relative: 2.9 % (ref 0.0–5.0)
HCT: 40.9 % (ref 36.0–46.0)
Hemoglobin: 13.5 g/dL (ref 12.0–15.0)
Lymphocytes Relative: 34.1 % (ref 12.0–46.0)
Lymphs Abs: 2.5 10*3/uL (ref 0.7–4.0)
MCHC: 33 g/dL (ref 30.0–36.0)
MCV: 90.4 fl (ref 78.0–100.0)
Monocytes Absolute: 0.5 10*3/uL (ref 0.1–1.0)
Monocytes Relative: 7.6 % (ref 3.0–12.0)
Neutro Abs: 4 10*3/uL (ref 1.4–7.7)
Neutrophils Relative %: 55 % (ref 43.0–77.0)
Platelets: 266 10*3/uL (ref 150.0–400.0)
RBC: 4.53 Mil/uL (ref 3.87–5.11)
RDW: 13.1 % (ref 11.5–15.5)
WBC: 7.2 10*3/uL (ref 4.0–10.5)

## 2023-02-13 NOTE — Assessment & Plan Note (Signed)
Chronic problem.  On Lipitor 40mg  daily and Gemfibrozil 600mg  BID w/o difficulty.  Check labs.  Adjust meds prn

## 2023-02-13 NOTE — Assessment & Plan Note (Signed)
Pt is down 3 labs since last visit.  Pt admits to stress eating and limited exercise.  Plans to work on both diet and exercise.  Check labs to risk stratify.  Will follow.

## 2023-02-13 NOTE — Assessment & Plan Note (Signed)
Chronic problem, on Losartan HCTZ 50/12.5mg  daily w/ adequate control.  Currently asymptomatic.  Check labs due to ARB and diuretic use.  No anticipated med changes.

## 2023-02-13 NOTE — Patient Instructions (Signed)
Schedule your complete physical in 6 months We'll notify you of your lab results and make any changes if needed Keep up the good work on healthy diet and regular exercise- you can do it! Call with any questions or concerns Stay Safe!  Stay Healthy! Have a great summer!!!

## 2023-02-13 NOTE — Progress Notes (Signed)
   Subjective:    Patient ID: Guerry Minors, female    DOB: 09-16-1956, 67 y.o.   MRN: [id number]  HPI HTN- chronic problem, on Losartan HCTZ 50/12.5mg  daily.  No CP, SOB, HA's, visual changes, edema.  Hyperlipidemia- chronic problem, on Lipitor 40mg  daily and Gemfibrozil 600 BID.  No abd pain, N/V.  Obesity- pt is down 3 lbs since last visit.  Husband's bladder cancer has returned and pt is stress eating.   Review of Systems For ROS see HPI     Objective:   Physical Exam Vitals reviewed.  Constitutional:      General: She is not in acute distress.    Appearance: Normal appearance. She is well-developed. She is obese. She is not ill-appearing.  HENT:     Head: Normocephalic and atraumatic.  Eyes:     Conjunctiva/sclera: Conjunctivae normal.     Pupils: Pupils are equal, round, and reactive to light.  Neck:     Thyroid: No thyromegaly.  Cardiovascular:     Rate and Rhythm: Normal rate and regular rhythm.     Heart sounds: Normal heart sounds. No murmur heard. Pulmonary:     Effort: Pulmonary effort is normal. No respiratory distress.     Breath sounds: Normal breath sounds.  Abdominal:     General: There is no distension.     Palpations: Abdomen is soft.     Tenderness: There is no abdominal tenderness.  Musculoskeletal:     Cervical back: Normal range of motion and neck supple.     Right lower leg: No edema.     Left lower leg: No edema.  Lymphadenopathy:     Cervical: No cervical adenopathy.  Skin:    General: Skin is warm and dry.  Neurological:     General: No focal deficit present.     Mental Status: She is alert and oriented to person, place, and time.  Psychiatric:        Mood and Affect: Mood normal.        Behavior: Behavior normal.        Thought Content: Thought content normal.           Assessment & Plan:

## 2023-02-14 ENCOUNTER — Other Ambulatory Visit: Payer: Self-pay

## 2023-02-14 ENCOUNTER — Telehealth: Payer: Self-pay

## 2023-02-14 DIAGNOSIS — R748 Abnormal levels of other serum enzymes: Secondary | ICD-10-CM

## 2023-02-14 LAB — LIPID PANEL
Cholesterol: 149 mg/dL (ref 0–200)
HDL: 31.8 mg/dL — ABNORMAL LOW (ref >39.00)
LDL Cholesterol: 92 mg/dL (ref 0–99)
NonHDL: 117.27
Total CHOL/HDL Ratio: 5
Triglycerides: 127 mg/dL (ref 0.0–149.0)
VLDL: 25.4 mg/dL (ref 0.0–40.0)

## 2023-02-14 LAB — BASIC METABOLIC PANEL
BUN: 22 mg/dL (ref 6–23)
CO2: 25 mEq/L (ref 19–32)
Calcium: 10 mg/dL (ref 8.4–10.5)
Chloride: 102 mEq/L (ref 96–112)
Creatinine, Ser: 0.8 mg/dL (ref 0.40–1.20)
GFR: 77.08 mL/min (ref >60.00)
Glucose, Bld: 103 mg/dL — ABNORMAL HIGH (ref 70–99)
Potassium: 3.7 mEq/L (ref 3.5–5.1)
Sodium: 138 mEq/L (ref 135–145)

## 2023-02-14 LAB — HEPATIC FUNCTION PANEL
ALT: 65 U/L — ABNORMAL HIGH (ref 0–35)
AST: 86 U/L — ABNORMAL HIGH (ref 0–37)
Albumin: 4.6 g/dL (ref 3.5–5.2)
Alkaline Phosphatase: 46 U/L (ref 39–117)
Bilirubin, Direct: 0.2 mg/dL (ref 0.0–0.3)
Total Bilirubin: 0.7 mg/dL (ref 0.2–1.2)
Total Protein: 7.9 g/dL (ref 6.0–8.3)

## 2023-02-14 LAB — TSH: TSH: 2.02 u[IU]/mL (ref 0.35–5.50)

## 2023-02-14 NOTE — Telephone Encounter (Signed)
-----   Message from Sheliah Hatch, MD sent at 02/14/2023  3:38 PM EDT ----- Labs look great w/ exception of liver enzymes.  Please hold tylenol and alcohol x2 weeks and we'll repeat your LFTs at a lab only visit (dx elevated LFTs).  Also try and eat a low carb/low sugar diet as this can help.

## 2023-02-14 NOTE — Telephone Encounter (Signed)
I left the lab results on pt VM . The repeat liver function order is in and pt will need a lab only visit for 2 wks

## 2023-02-20 NOTE — Telephone Encounter (Signed)
I spoke to pt and she has made her lab only visit for 03/06/23

## 2023-02-22 ENCOUNTER — Other Ambulatory Visit: Payer: Self-pay | Admitting: Family Medicine

## 2023-03-06 ENCOUNTER — Other Ambulatory Visit (INDEPENDENT_AMBULATORY_CARE_PROVIDER_SITE_OTHER): Payer: Medicare Other

## 2023-03-06 DIAGNOSIS — R748 Abnormal levels of other serum enzymes: Secondary | ICD-10-CM

## 2023-03-07 ENCOUNTER — Telehealth: Payer: Self-pay

## 2023-03-07 LAB — HEPATIC FUNCTION PANEL
ALT: 68 U/L — ABNORMAL HIGH (ref 0–35)
AST: 76 U/L — ABNORMAL HIGH (ref 0–37)
Albumin: 4.8 g/dL (ref 3.5–5.2)
Alkaline Phosphatase: 50 U/L (ref 39–117)
Bilirubin, Direct: 0.1 mg/dL (ref 0.0–0.3)
Total Bilirubin: 0.7 mg/dL (ref 0.2–1.2)
Total Protein: 8.1 g/dL (ref 6.0–8.3)

## 2023-03-07 NOTE — Telephone Encounter (Signed)
-----   Message from Sheliah Hatch, MD sent at 03/07/2023  3:31 PM EDT ----- Liver functions are stable and AST is trending down.  This will continue to improve w/ a low carb/low sugar diet and regular physical activity.

## 2023-03-08 ENCOUNTER — Other Ambulatory Visit: Payer: Self-pay | Admitting: Family Medicine

## 2023-03-08 DIAGNOSIS — I10 Essential (primary) hypertension: Secondary | ICD-10-CM

## 2023-03-11 ENCOUNTER — Other Ambulatory Visit: Payer: Self-pay | Admitting: Family Medicine

## 2023-03-21 ENCOUNTER — Telehealth: Payer: Self-pay | Admitting: Pharmacist

## 2023-03-21 NOTE — Telephone Encounter (Signed)
Pharmacy Quality Measure Review  This patient is appearing on a report for being at risk of failing the adherence measure for hypertension (ACEi/ARB) medications this calendar year.   Medication: losartan HCTZ Last fill date: 02/12/2023 for 30 day supply  Spoke with pharmacy - patient picked up RF for 30 days on 03/11/2023 - filled losartan hydrochlorothiazide on time.

## 2023-03-27 ENCOUNTER — Other Ambulatory Visit: Payer: Self-pay | Admitting: Family Medicine

## 2023-03-27 DIAGNOSIS — E785 Hyperlipidemia, unspecified: Secondary | ICD-10-CM

## 2023-03-27 DIAGNOSIS — M8588 Other specified disorders of bone density and structure, other site: Secondary | ICD-10-CM | POA: Diagnosis not present

## 2023-03-27 LAB — HM DEXA SCAN

## 2023-06-02 ENCOUNTER — Other Ambulatory Visit: Payer: Self-pay | Admitting: Family Medicine

## 2023-07-05 ENCOUNTER — Other Ambulatory Visit: Payer: Self-pay | Admitting: Family Medicine

## 2023-07-05 DIAGNOSIS — E785 Hyperlipidemia, unspecified: Secondary | ICD-10-CM

## 2023-07-26 IMAGING — DX DG ABDOMEN 1V
2 series · 2 of 2 positions shown · non-contrast
Comparison: KUB, most recently 11/03/2021.  CT urogram, 10/27/2021.

CLINICAL DATA: Pre lithotripsy.

EXAM:
ABDOMEN - 1 VIEW

[abdomen kub (1 of 2)]
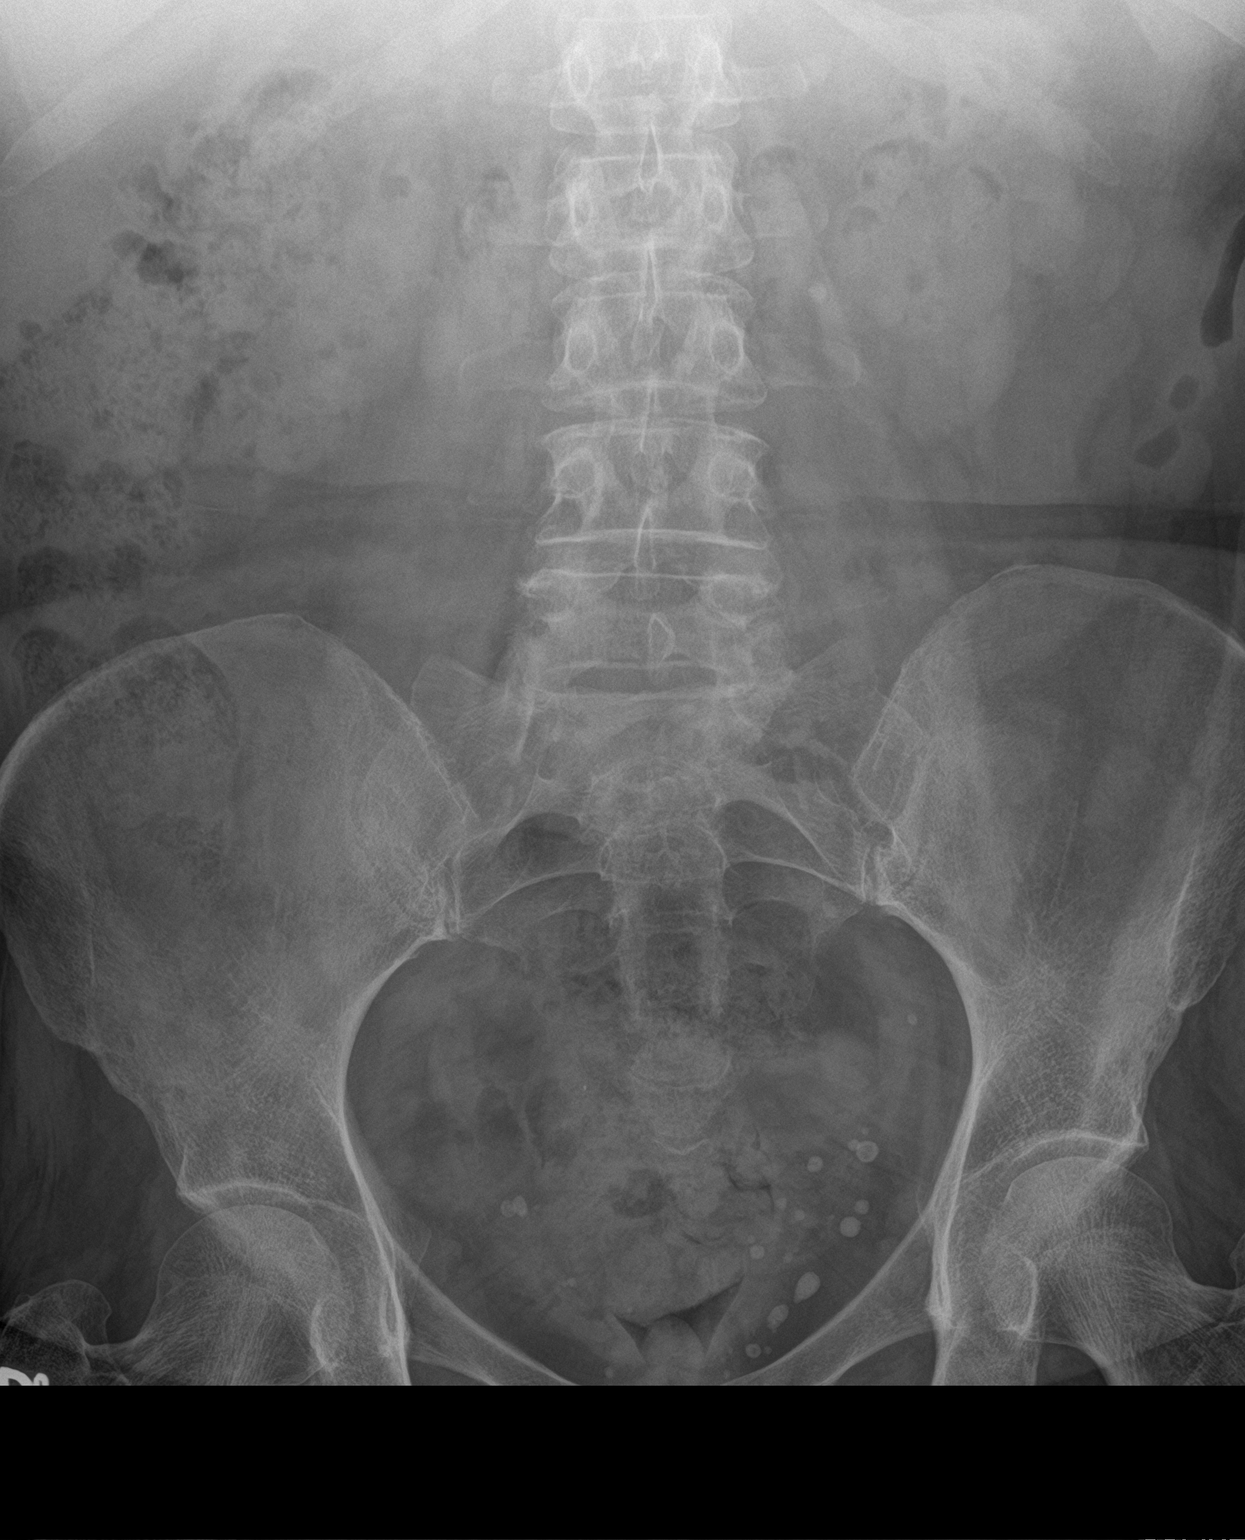

[abdomen kub (2 of 2)]
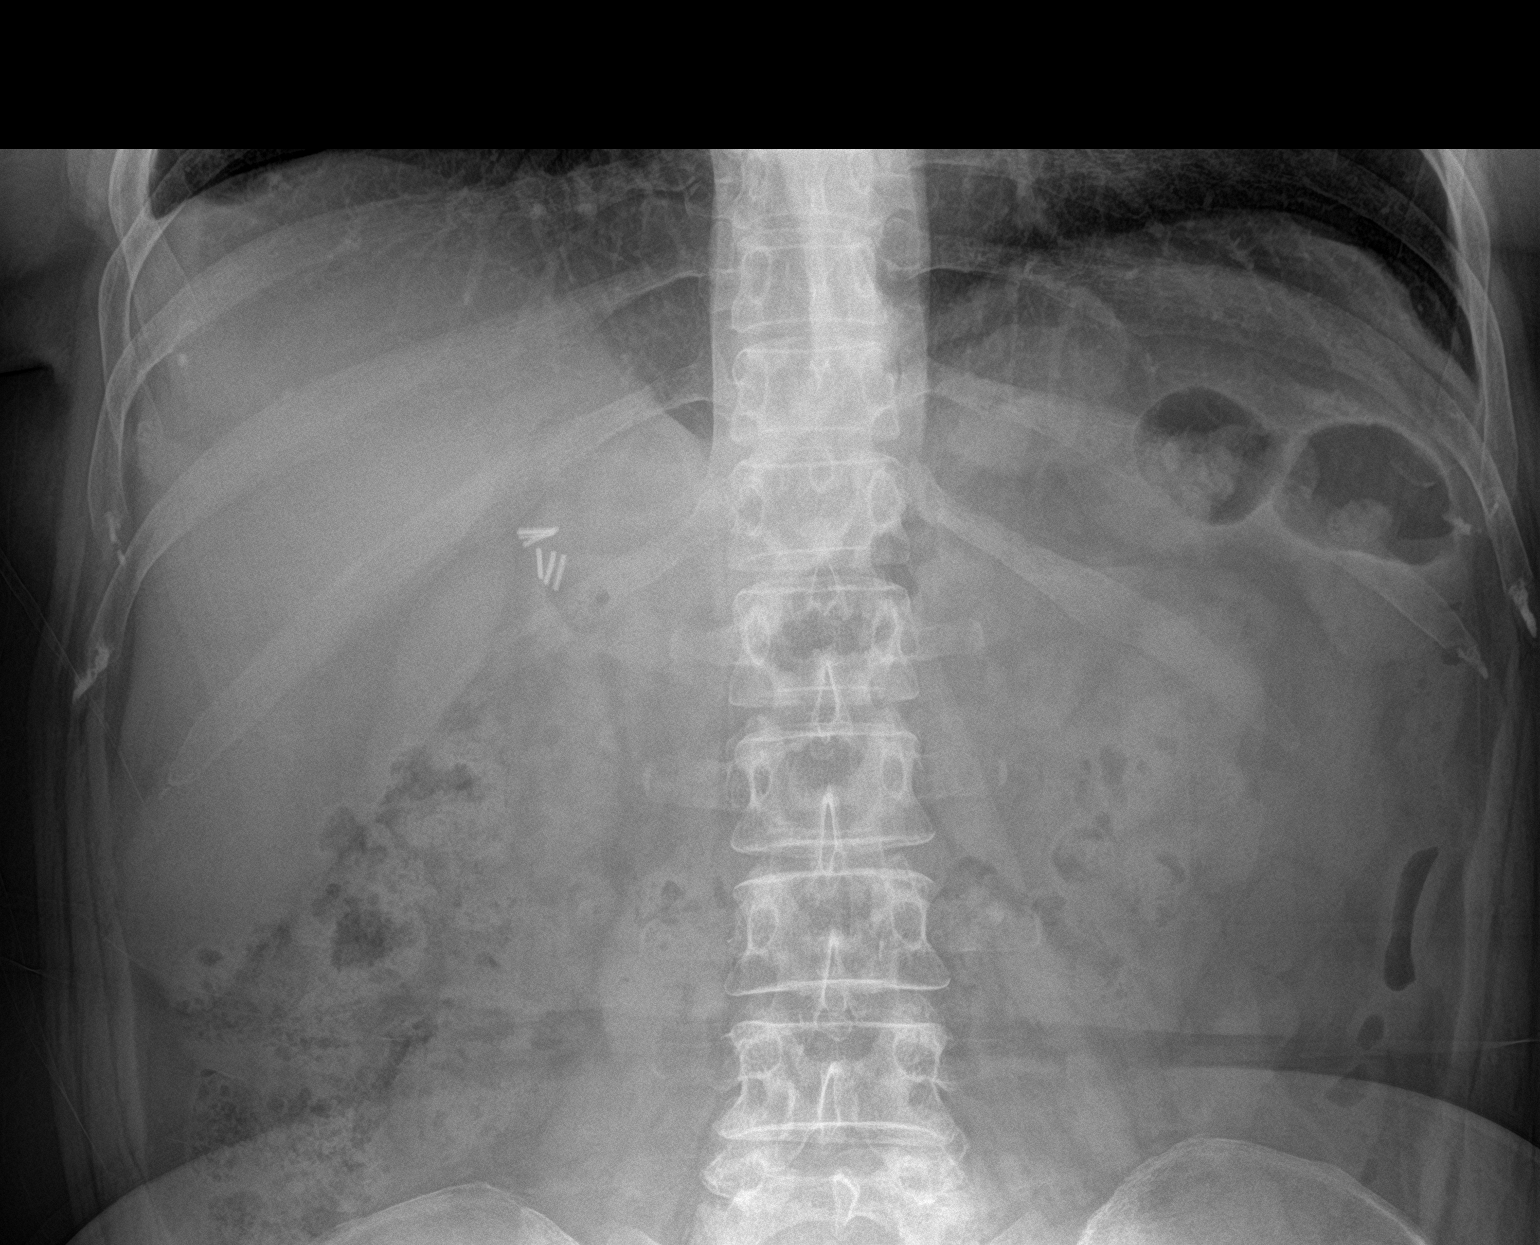

[2 of 2 positions shown; findings below may reference images not displayed]

FINDINGS: Nonobstructed bowel gas pattern. Cholecystectomy clips. Multiple
pelvic phleboliths. No acute osseous findings.

4 mm LEFT proximal ureteral stone, similar in location to comparison
CT urogram. See key image.
IMPRESSION: 4 mm LEFT proximal ureteral stone.

## 2023-08-16 ENCOUNTER — Ambulatory Visit (INDEPENDENT_AMBULATORY_CARE_PROVIDER_SITE_OTHER): Payer: Medicare Other | Admitting: Family Medicine

## 2023-08-16 ENCOUNTER — Encounter: Payer: Self-pay | Admitting: Family Medicine

## 2023-08-16 VITALS — BP 138/78 | HR 91 | Temp 97.8°F | Ht 68.0 in | Wt 202.5 lb

## 2023-08-16 DIAGNOSIS — Z23 Encounter for immunization: Secondary | ICD-10-CM | POA: Diagnosis not present

## 2023-08-16 DIAGNOSIS — M8580 Other specified disorders of bone density and structure, unspecified site: Secondary | ICD-10-CM

## 2023-08-16 DIAGNOSIS — Z0000 Encounter for general adult medical examination without abnormal findings: Secondary | ICD-10-CM | POA: Diagnosis not present

## 2023-08-16 DIAGNOSIS — E785 Hyperlipidemia, unspecified: Secondary | ICD-10-CM | POA: Diagnosis not present

## 2023-08-16 DIAGNOSIS — E210 Primary hyperparathyroidism: Secondary | ICD-10-CM | POA: Diagnosis not present

## 2023-08-16 DIAGNOSIS — I10 Essential (primary) hypertension: Secondary | ICD-10-CM

## 2023-08-16 NOTE — Assessment & Plan Note (Signed)
Chronic problem.  Adequate control.  Currently asymptomatic.  Check labs due to ARB and diuretic use but no anticipated med changes.  Will follow

## 2023-08-16 NOTE — Assessment & Plan Note (Signed)
Pt's PE WNL w/ exception of BMI.  UTD on mammo,colonoscopy, Tdap, PNA.  Flu shot given today.  Check labs.  Anticipatory guidance provided.

## 2023-08-16 NOTE — Progress Notes (Signed)
   Subjective:    Patient ID: Melissa Miles, female    DOB: 29-Mar-1956, 67 y.o.   MRN: [id number]  HPI CPE- due for flu.  UTD on mammo, colonoscopy, Tdap, PNA  Patient Care Team    Relationship Specialty Notifications Start End  Sheliah Hatch, MD PCP - General Family Medicine  09/25/22   Teodora Medici, MD Consulting Physician Gynecology  07/20/15   Meryl Dare, MD Consulting Physician Gastroenterology  07/29/19     Health Maintenance  Topic Date Due   Zoster Vaccines- Shingrix (1 of 2) Never done   DEXA SCAN  03/24/2023   INFLUENZA VACCINE  04/25/2023   COVID-19 Vaccine (4 - 2023-24 season) 05/26/2023   Medicare Annual Wellness (AWV)  08/16/2023   MAMMOGRAM  09/22/2023   Colonoscopy  03/26/2027   DTaP/Tdap/Td (3 - Td or Tdap) 08/10/2031   Pneumonia Vaccine 110+ Years old  Completed   Hepatitis C Screening  Completed   HPV VACCINES  Aged Out      Review of Systems Patient reports no vision/ hearing changes, adenopathy,fever, weight change,  persistant/recurrent hoarseness , swallowing issues, chest pain, palpitations, edema, persistant/recurrent cough, hemoptysis, dyspnea (rest/exertional/paroxysmal nocturnal), gastrointestinal bleeding (melena, rectal bleeding), abdominal pain, significant heartburn, bowel changes, GU symptoms (dysuria, hematuria, incontinence), Gyn symptoms (abnormal  bleeding, pain),  syncope, focal weakness, memory loss, numbness & tingling, skin/hair/nail changes, abnormal bruising or bleeding, anxiety, or depression.     Objective:   Physical Exam General Appearance:    Alert, cooperative, no distress, appears stated age  Head:    Normocephalic, without obvious abnormality, atraumatic  Eyes:    PERRL, conjunctiva/corneas clear, EOM's intact both eyes  Ears:    Normal TM's and external ear canals, both ears  Nose:   Nares normal, septum midline, mucosa normal, no drainage    or sinus tenderness  Throat:   Lips, mucosa, and tongue normal; teeth  and gums normal  Neck:   Supple, symmetrical, trachea midline, no adenopathy;    Thyroid: no enlargement/tenderness/nodules  Back:     Symmetric, no curvature, ROM normal, no CVA tenderness  Lungs:     Clear to auscultation bilaterally, respirations unlabored  Chest Wall:    No tenderness or deformity   Heart:    Regular rate and rhythm, S1 and S2 normal, no murmur, rub   or gallop  Breast Exam:    Deferred to GYN  Abdomen:     Soft, non-tender, bowel sounds active all four quadrants,    no masses, no organomegaly  Genitalia:    Deferred to GYN  Rectal:    Extremities:   Extremities normal, atraumatic, no cyanosis or edema  Pulses:   2+ and symmetric all extremities  Skin:   Skin color, texture, turgor normal, no rashes or lesions  Lymph nodes:   Cervical, supraclavicular, and axillary nodes normal  Neurologic:   CNII-XII intact, normal strength, sensation and reflexes    throughout          Assessment & Plan:

## 2023-08-16 NOTE — Patient Instructions (Signed)
Follow up in 6 months to recheck BP and cholesterol We'll notify you of your lab results and make any changes if needed Keep up the good work on healthy diet and regular exercise- you're doing great! Call with any questions or concerns Stay Safe!  Stay Healthy! Happy Holidays!!!

## 2023-08-17 LAB — CBC WITH DIFFERENTIAL/PLATELET
Absolute Lymphocytes: 2509 {cells}/uL (ref 850–3900)
Absolute Monocytes: 462 {cells}/uL (ref 200–950)
Basophils Absolute: 41 {cells}/uL (ref 0–200)
Basophils Relative: 0.6 %
Eosinophils Absolute: 265 {cells}/uL (ref 15–500)
Eosinophils Relative: 3.9 %
HCT: 41.4 % (ref 35.0–45.0)
Hemoglobin: 14 g/dL (ref 11.7–15.5)
MCH: 30.2 pg (ref 27.0–33.0)
MCHC: 33.8 g/dL (ref 32.0–36.0)
MCV: 89.2 fL (ref 80.0–100.0)
MPV: 11.3 fL (ref 7.5–12.5)
Monocytes Relative: 6.8 %
Neutro Abs: 3522 {cells}/uL (ref 1500–7800)
Neutrophils Relative %: 51.8 %
Platelets: 275 10*3/uL (ref 140–400)
RBC: 4.64 10*6/uL (ref 3.80–5.10)
RDW: 11.8 % (ref 11.0–15.0)
Total Lymphocyte: 36.9 %
WBC: 6.8 10*3/uL (ref 3.8–10.8)

## 2023-08-17 LAB — HEPATIC FUNCTION PANEL
AG Ratio: 1.5 (calc) (ref 1.0–2.5)
ALT: 69 U/L — ABNORMAL HIGH (ref 6–29)
AST: 66 U/L — ABNORMAL HIGH (ref 10–35)
Albumin: 4.6 g/dL (ref 3.6–5.1)
Alkaline phosphatase (APISO): 54 U/L (ref 37–153)
Bilirubin, Direct: 0.1 mg/dL (ref 0.0–0.2)
Globulin: 3 g/dL (ref 1.9–3.7)
Indirect Bilirubin: 0.6 mg/dL (ref 0.2–1.2)
Total Bilirubin: 0.7 mg/dL (ref 0.2–1.2)
Total Protein: 7.6 g/dL (ref 6.1–8.1)

## 2023-08-17 LAB — LIPID PANEL
Cholesterol: 151 mg/dL (ref <200)
HDL: 33 mg/dL — ABNORMAL LOW (ref >=50)
LDL Cholesterol (Calc): 93 mg/dL
Non-HDL Cholesterol (Calc): 118 mg/dL (ref <130)
Total CHOL/HDL Ratio: 4.6 (calc) (ref <5.0)
Triglycerides: 157 mg/dL — ABNORMAL HIGH (ref <150)

## 2023-08-17 LAB — BASIC METABOLIC PANEL
BUN: 18 mg/dL (ref 7–25)
CO2: 27 mmol/L (ref 20–32)
Calcium: 9.9 mg/dL (ref 8.6–10.4)
Chloride: 100 mmol/L (ref 98–110)
Creat: 0.71 mg/dL (ref 0.50–1.05)
Glucose, Bld: 96 mg/dL (ref 65–99)
Potassium: 3.8 mmol/L (ref 3.5–5.3)
Sodium: 137 mmol/L (ref 135–146)

## 2023-08-17 LAB — TSH: TSH: 0.86 m[IU]/L (ref 0.40–4.50)

## 2023-08-17 LAB — VITAMIN D 25 HYDROXY (VIT D DEFICIENCY, FRACTURES): Vit D, 25-Hydroxy: 42 ng/mL (ref 30–100)

## 2023-08-19 ENCOUNTER — Telehealth: Payer: Self-pay

## 2023-08-19 NOTE — Telephone Encounter (Signed)
Pt has been notified.

## 2023-08-19 NOTE — Telephone Encounter (Signed)
-----   Message from Neena Rhymes sent at 08/19/2023  7:39 AM EST ----- Labs are stable and look good!  No changes at this time

## 2023-08-26 ENCOUNTER — Other Ambulatory Visit: Payer: Self-pay | Admitting: Family Medicine

## 2023-09-03 ENCOUNTER — Other Ambulatory Visit: Payer: Self-pay | Admitting: Family Medicine

## 2023-09-03 ENCOUNTER — Ambulatory Visit: Payer: Medicare Other

## 2023-09-03 ENCOUNTER — Other Ambulatory Visit: Payer: Self-pay

## 2023-09-19 ENCOUNTER — Other Ambulatory Visit: Payer: Self-pay | Admitting: Family Medicine

## 2023-09-19 DIAGNOSIS — I10 Essential (primary) hypertension: Secondary | ICD-10-CM

## 2023-09-30 DIAGNOSIS — Z6831 Body mass index (BMI) 31.0-31.9, adult: Secondary | ICD-10-CM | POA: Diagnosis not present

## 2023-09-30 DIAGNOSIS — Z1231 Encounter for screening mammogram for malignant neoplasm of breast: Secondary | ICD-10-CM | POA: Diagnosis not present

## 2023-09-30 DIAGNOSIS — Z01419 Encounter for gynecological examination (general) (routine) without abnormal findings: Secondary | ICD-10-CM | POA: Diagnosis not present

## 2023-09-30 LAB — HM MAMMOGRAPHY

## 2023-10-01 ENCOUNTER — Telehealth: Payer: Self-pay | Admitting: Family Medicine

## 2023-10-01 NOTE — Telephone Encounter (Signed)
 Type of form received:[facility] -Request for Billing Info    Additional comments:   Received by:  Form should be Faxed/mailed to: [phone]  Is patient requesting call for pickup:N/A  Form placed: [facility] charge sheet.  Provider will determine charge.N/A  Individual made aware of 3-5 business day turn around No?

## 2023-10-01 NOTE — Telephone Encounter (Signed)
 Placed in folder at nurse station

## 2023-10-03 NOTE — Telephone Encounter (Signed)
 Faxed and placed in front bin to be scanned

## 2023-10-03 NOTE — Telephone Encounter (Signed)
 Form completed and returned to [place]

## 2023-10-08 ENCOUNTER — Other Ambulatory Visit: Payer: Self-pay | Admitting: Family Medicine

## 2023-10-08 DIAGNOSIS — E785 Hyperlipidemia, unspecified: Secondary | ICD-10-CM

## 2023-10-08 DIAGNOSIS — I10 Essential (primary) hypertension: Secondary | ICD-10-CM

## 2023-11-11 ENCOUNTER — Other Ambulatory Visit: Payer: Self-pay | Admitting: Family Medicine

## 2023-11-11 DIAGNOSIS — I10 Essential (primary) hypertension: Secondary | ICD-10-CM

## 2023-11-25 ENCOUNTER — Other Ambulatory Visit: Payer: Self-pay | Admitting: Family Medicine

## 2023-11-25 IMAGING — US US THYROID
1 series · 13 of 25 positions shown · non-contrast
Comparison: None Available.

CLINICAL DATA: 65-year-old female with primary hyperparathyroidism

EXAM:
THYROID ULTRASOUND
TECHNIQUE: Ultrasound examination of the thyroid gland and adjacent soft
tissues was performed.

[Series 1: us thyroid · 0.05mm/px · 13 of 38 slices shown]
[im 1/38]
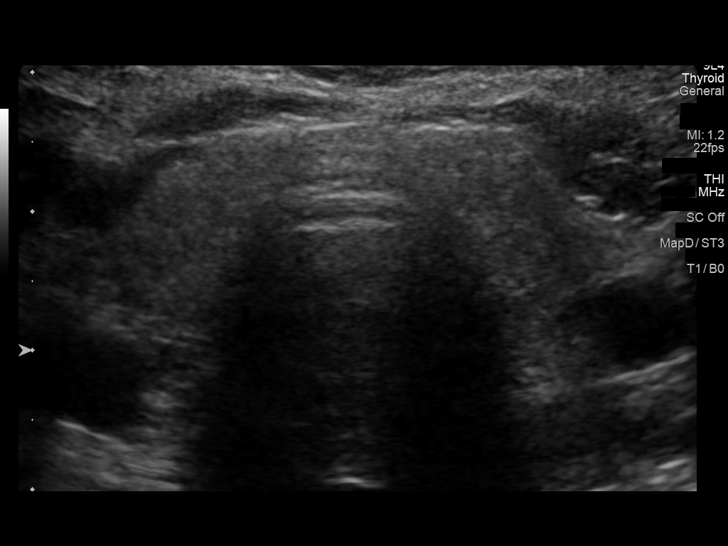
[im 4/38]
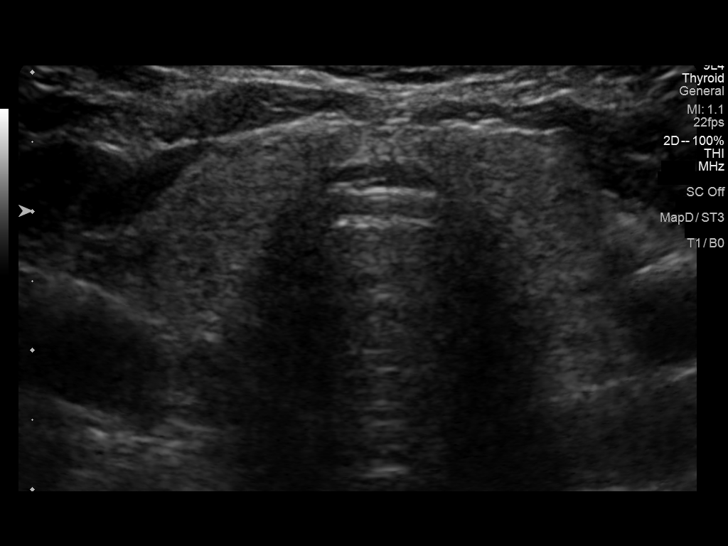
[im 7/38]
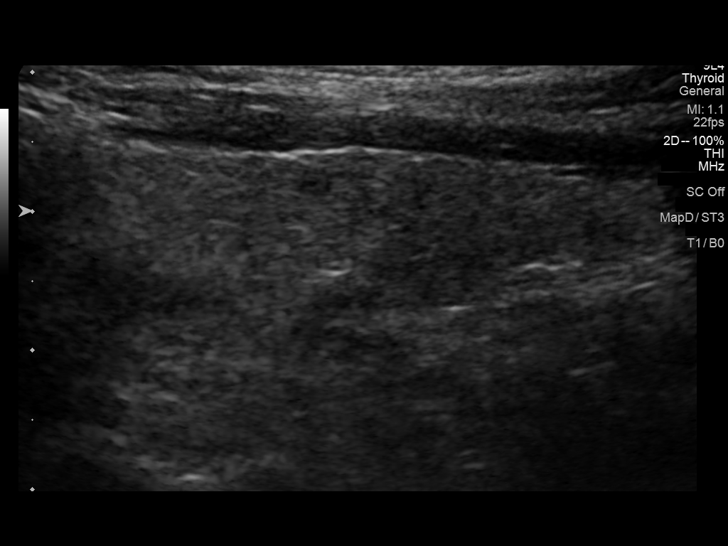
[im 10/38]
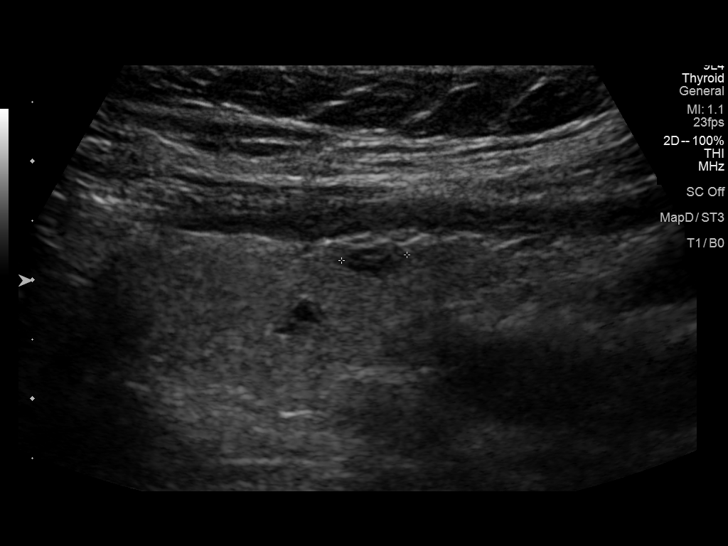
[im 13/38]
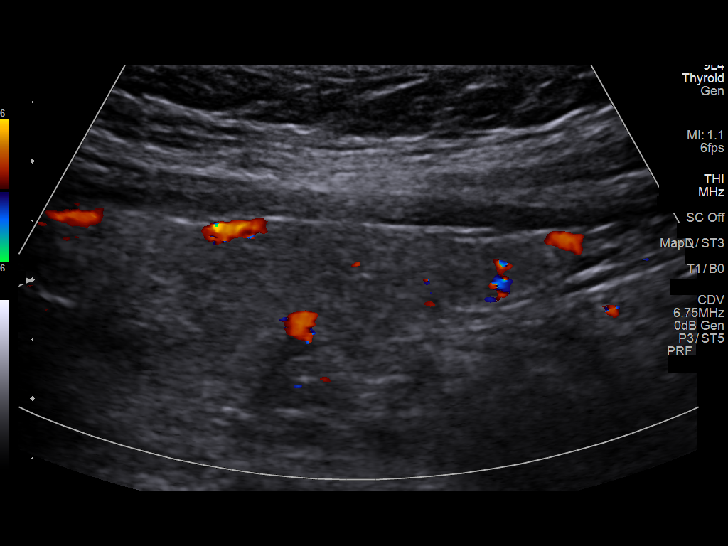
[im 16/38]
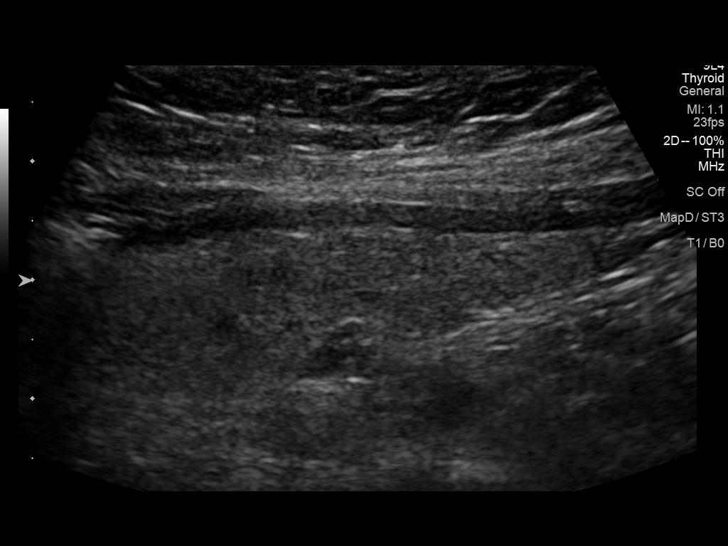
[im 19/38]
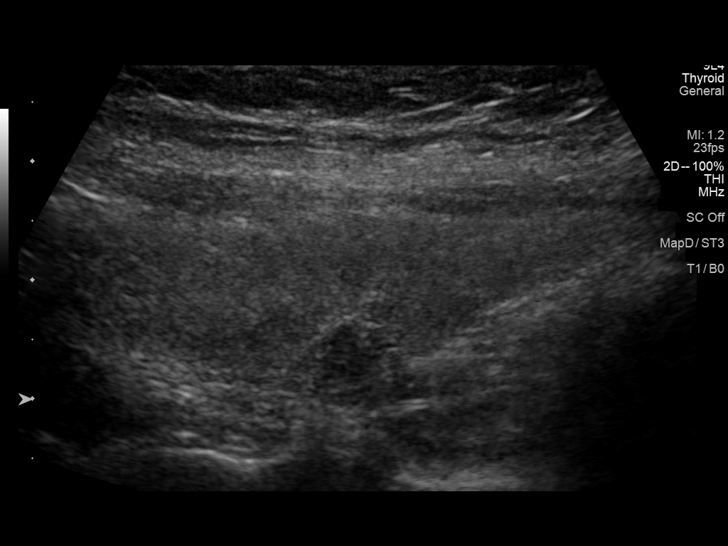
[im 22/38]
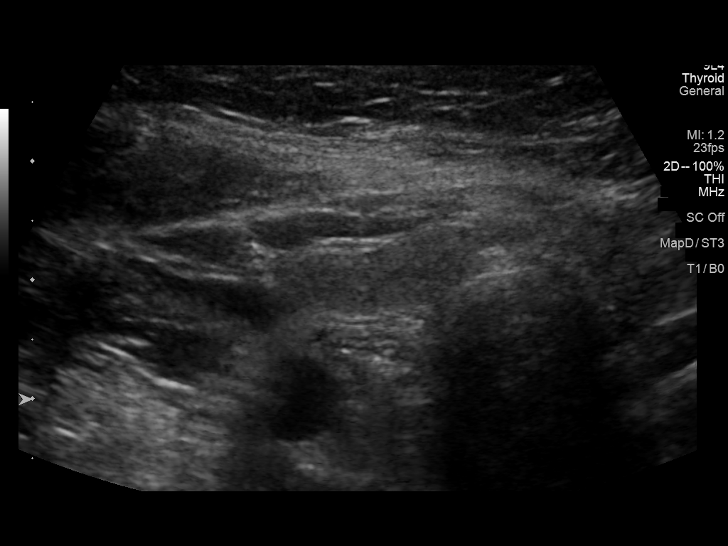
[im 25/38]
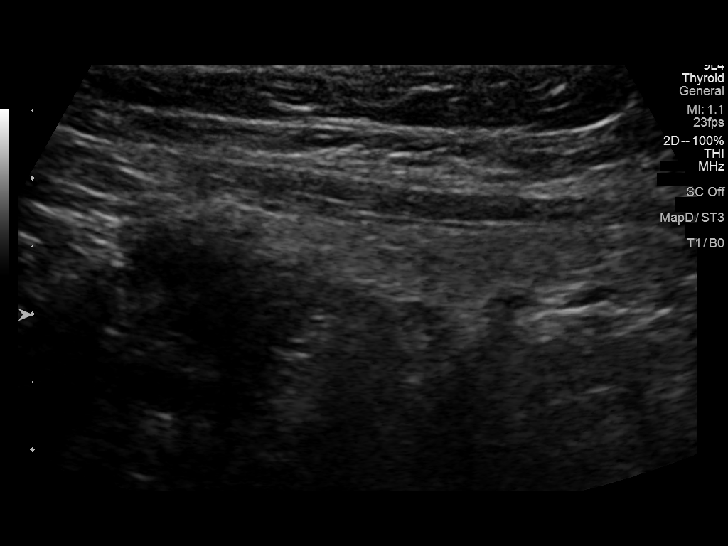
[im 28/38]
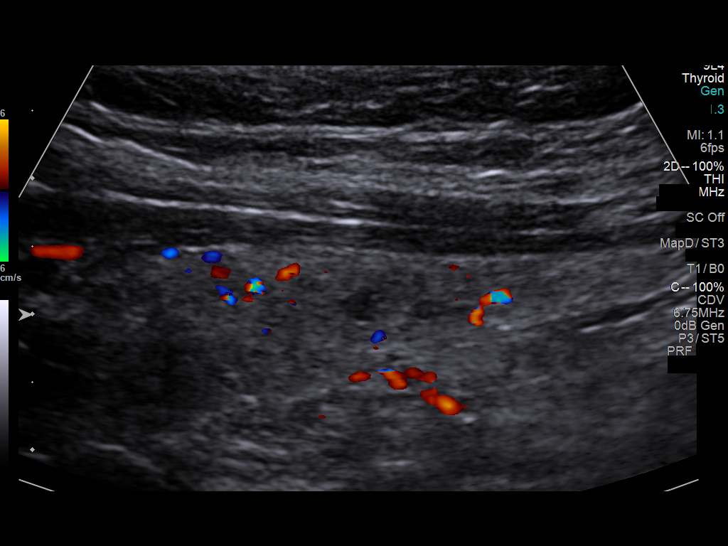
[im 31/38]
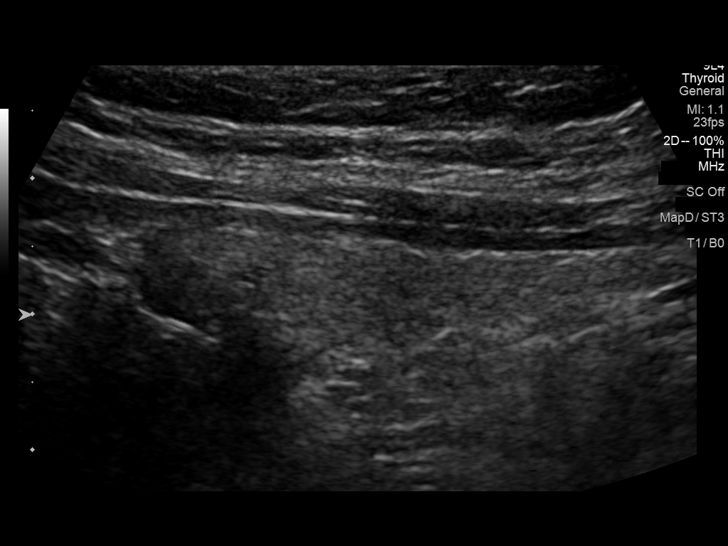
[im 34/38]
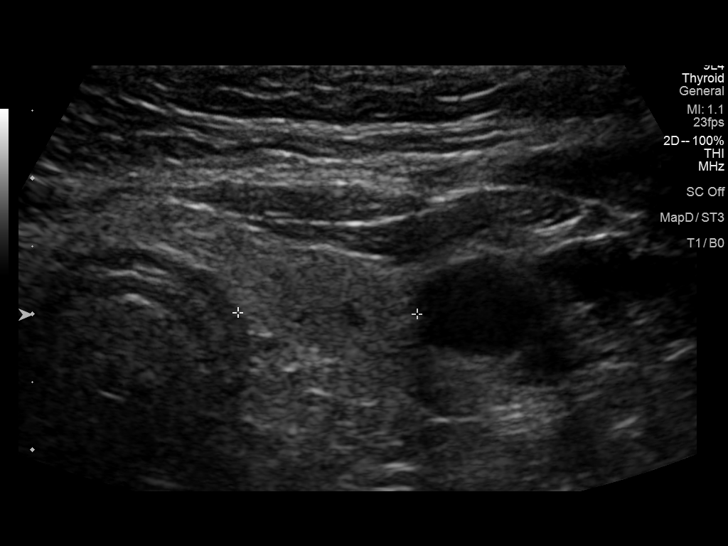
[im 38/38]
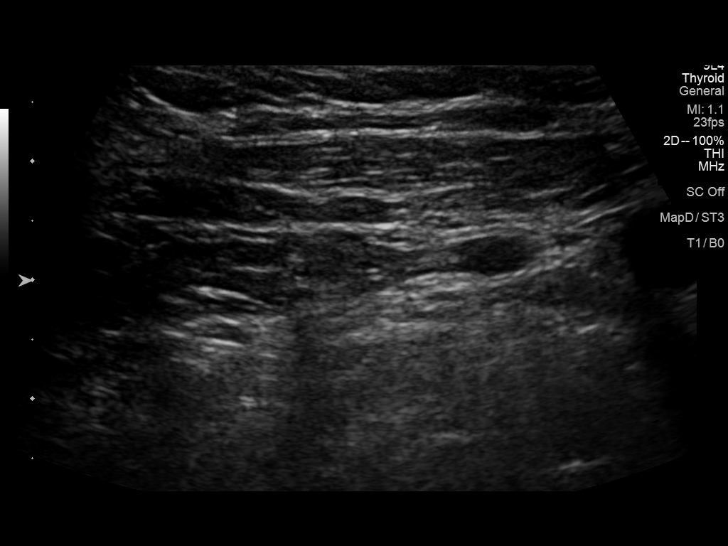

[13 of 25 positions shown; findings below may reference images not displayed]

FINDINGS: Parenchymal Echotexture: Mildly heterogenous

Isthmus: 0.4 cm

Right lobe: 4.4 cm x 1.2 cm x 1.3 cm

Left lobe: 3.5 cm x 1.3 cm x 1.3 cm

_________________________________________________________

Estimated total number of nodules >/= 1 cm: 0

Number of spongiform nodules >/=  2 cm not described below (TR1): 0

Number of mixed cystic and solid nodules >/= 1.5 cm not described
below (TR2): 0

_________________________________________________________

Nodule # 1:

Location: Right; Mid

Maximum size: 0.6 cm; Other 2 dimensions: 0.5 cm x 0.3 cm

Composition: cannot determine (2)

Echogenicity: hypoechoic (2)

Shape: not taller-than-wide (0)

Margins: ill-defined (0)

Echogenic foci: none (0)

ACR TI-RADS total points: 4.

ACR TI-RADS risk category: TR4 (4-6 points).

ACR TI-RADS recommendations:

Nodule does not meet criteria for surveillance

_________________________________________________________

Nodule labeled 2 at the inferior deep right thyroid measures 8 mm x
7 mm x 7 mm. Margins are ill-defined, and this soft tissue may
reflect a thyroid nodule on the margin, extending slightly beyond
the thyroid, or alternatively a parathyroid gland with impression on
the posterior thyroid.

_________________________________________________________

No adenopathy

Recommendations follow those established by the new ACR TI-RADS
criteria ([HOSPITAL] 3782;[DATE]).
IMPRESSION: Mildly heterogeneous thyroid may indicate medical thyroid disease.

There is a hypoechoic 8 mm nodule at the posterior margin of the
right inferior thyroid, which is either a thyroid nodule extending
beyond the thyroid border, or potentially parathyroid tissue. If
differentiating is of clinical concern, consider contrast-enhanced
neck CT.

## 2023-12-05 ENCOUNTER — Other Ambulatory Visit: Payer: Self-pay | Admitting: Family Medicine

## 2024-01-03 ENCOUNTER — Other Ambulatory Visit: Payer: Self-pay | Admitting: Family Medicine

## 2024-01-03 DIAGNOSIS — E785 Hyperlipidemia, unspecified: Secondary | ICD-10-CM

## 2024-02-06 DIAGNOSIS — R300 Dysuria: Secondary | ICD-10-CM | POA: Diagnosis not present

## 2024-02-06 DIAGNOSIS — R319 Hematuria, unspecified: Secondary | ICD-10-CM | POA: Diagnosis not present

## 2024-02-14 ENCOUNTER — Ambulatory Visit: Payer: Medicare Other | Admitting: Family Medicine

## 2024-02-19 ENCOUNTER — Other Ambulatory Visit: Payer: Self-pay | Admitting: Family Medicine

## 2024-02-24 ENCOUNTER — Ambulatory Visit: Admitting: Family Medicine

## 2024-02-24 ENCOUNTER — Encounter: Payer: Self-pay | Admitting: Family Medicine

## 2024-02-24 VITALS — BP 110/72 | HR 94 | Temp 98.0°F | Ht 68.0 in | Wt 202.5 lb

## 2024-02-24 DIAGNOSIS — E669 Obesity, unspecified: Secondary | ICD-10-CM | POA: Diagnosis not present

## 2024-02-24 DIAGNOSIS — I10 Essential (primary) hypertension: Secondary | ICD-10-CM

## 2024-02-24 DIAGNOSIS — E785 Hyperlipidemia, unspecified: Secondary | ICD-10-CM

## 2024-02-24 NOTE — Progress Notes (Signed)
   Subjective:    Patient ID: Melissa Miles, female    DOB: [birth year], 68 y.o.   MRN: [id number]  HPI HTN- chronic problem.  On Losartan  hydrochlorothiazide 50/12.5mg  daily w/ good control.  No CP, SOB, HA's, visual changes, edema.  Hyperlipidemia- chronic problem.  Currently on Lipitor 40mg  daily and Gemfibrozil  600mg  BID.  Denies abd pain, N/V.  Obesity- ongoing issue for pt.  Weight and BMI are stable.  Current BMI 30.79.  no regular exercise.   Review of Systems For ROS see HPI     Objective:   Physical Exam Vitals reviewed.  Constitutional:      General: She is not in acute distress.    Appearance: Normal appearance. She is well-developed.  HENT:     Head: Normocephalic and atraumatic.  Eyes:     Conjunctiva/sclera: Conjunctivae normal.     Pupils: Pupils are equal, round, and reactive to light.  Neck:     Thyroid : No thyromegaly.  Cardiovascular:     Rate and Rhythm: Normal rate and regular rhythm.     Pulses: Normal pulses.     Heart sounds: Normal heart sounds. No murmur heard. Pulmonary:     Effort: Pulmonary effort is normal. No respiratory distress.     Breath sounds: Normal breath sounds.  Abdominal:     General: There is no distension.     Palpations: Abdomen is soft.     Tenderness: There is no abdominal tenderness.  Musculoskeletal:     Cervical back: Normal range of motion and neck supple.     Right lower leg: No edema.     Left lower leg: No edema.  Lymphadenopathy:     Cervical: No cervical adenopathy.  Skin:    General: Skin is warm and dry.  Neurological:     General: No focal deficit present.     Mental Status: She is alert and oriented to person, place, and time.  Psychiatric:        Mood and Affect: Mood normal.        Behavior: Behavior normal.          Assessment & Plan:

## 2024-02-24 NOTE — Patient Instructions (Signed)
Schedule your complete physical in 6 months We'll notify you of your lab results and make any changes if needed Keep up the good work on healthy diet and regular exercise- you can do it! Call with any questions or concerns Stay Safe!  Stay Healthy! Have a great summer!!!

## 2024-02-25 LAB — CBC WITH DIFFERENTIAL/PLATELET
Basophils Absolute: 0.1 10*3/uL (ref 0.0–0.1)
Basophils Relative: 0.9 % (ref 0.0–3.0)
Eosinophils Absolute: 0.2 10*3/uL (ref 0.0–0.7)
Eosinophils Relative: 3.1 % (ref 0.0–5.0)
HCT: 41.2 % (ref 36.0–46.0)
Hemoglobin: 13.8 g/dL (ref 12.0–15.0)
Lymphocytes Relative: 36.8 % (ref 12.0–46.0)
Lymphs Abs: 2.5 10*3/uL (ref 0.7–4.0)
MCHC: 33.4 g/dL (ref 30.0–36.0)
MCV: 89.1 fl (ref 78.0–100.0)
Monocytes Absolute: 0.5 10*3/uL (ref 0.1–1.0)
Monocytes Relative: 7.5 % (ref 3.0–12.0)
Neutro Abs: 3.5 10*3/uL (ref 1.4–7.7)
Neutrophils Relative %: 51.7 % (ref 43.0–77.0)
Platelets: 278 10*3/uL (ref 150.0–400.0)
RBC: 4.62 Mil/uL (ref 3.87–5.11)
RDW: 13.2 % (ref 11.5–15.5)
WBC: 6.8 10*3/uL (ref 4.0–10.5)

## 2024-02-25 LAB — TSH: TSH: 1.91 u[IU]/mL (ref 0.35–5.50)

## 2024-02-25 LAB — BASIC METABOLIC PANEL WITH GFR
BUN: 20 mg/dL (ref 6–23)
CO2: 27 meq/L (ref 19–32)
Calcium: 9.7 mg/dL (ref 8.4–10.5)
Chloride: 100 meq/L (ref 96–112)
Creatinine, Ser: 0.71 mg/dL (ref 0.40–1.20)
GFR: 88.31 mL/min (ref >60.00)
Glucose, Bld: 93 mg/dL (ref 70–99)
Potassium: 3.8 meq/L (ref 3.5–5.1)
Sodium: 137 meq/L (ref 135–145)

## 2024-02-25 LAB — HEPATIC FUNCTION PANEL
ALT: 64 U/L — ABNORMAL HIGH (ref 0–35)
AST: 73 U/L — ABNORMAL HIGH (ref 0–37)
Albumin: 4.7 g/dL (ref 3.5–5.2)
Alkaline Phosphatase: 47 U/L (ref 39–117)
Bilirubin, Direct: 0.1 mg/dL (ref 0.0–0.3)
Total Bilirubin: 0.6 mg/dL (ref 0.2–1.2)
Total Protein: 8.1 g/dL (ref 6.0–8.3)

## 2024-02-25 LAB — LIPID PANEL
Cholesterol: 141 mg/dL (ref 0–200)
HDL: 32.6 mg/dL — ABNORMAL LOW (ref >39.00)
LDL Cholesterol: 83 mg/dL (ref 0–99)
NonHDL: 108.47
Total CHOL/HDL Ratio: 4
Triglycerides: 127 mg/dL (ref 0.0–149.0)
VLDL: 25.4 mg/dL (ref 0.0–40.0)

## 2024-02-26 ENCOUNTER — Ambulatory Visit: Payer: Self-pay | Admitting: Family Medicine

## 2024-03-23 NOTE — Assessment & Plan Note (Signed)
 Ongoing issue.  Weight and BMI are stable.  Pt admits no regular exercise.  Stressed the need for exercise and healthy food choices.  Will follow.

## 2024-03-23 NOTE — Assessment & Plan Note (Signed)
 Chronic problem.  On Lipitor 40mg  and Gemfibrozil  600mg  BID.  Currently asymptomatic.  Check labs.  Adjust meds prn

## 2024-03-23 NOTE — Assessment & Plan Note (Signed)
 Chronic problem.  On Losartan hydrochlorothiazide w/ good control.  Currently asymptomatic.  Check labs due to ARB and diuretic use but no anticipated med changes.  Will follow.

## 2024-03-31 ENCOUNTER — Telehealth: Payer: Self-pay

## 2024-03-31 NOTE — Telephone Encounter (Unsigned)
 Copied from CRM [id number]. Topic: General - Other >> Mar 31, 2024  4:07 PM Thersia C wrote: Reason for CRM: Patient called in regarding a missed call from [facility], informed patient that [place]com has left for a day, patient stated she would like for her to call back in the morning

## 2024-03-31 NOTE — Telephone Encounter (Signed)
Lwv

## 2024-04-01 NOTE — Telephone Encounter (Signed)
 Pt has been schedule for AWV

## 2024-04-11 ENCOUNTER — Other Ambulatory Visit: Payer: Self-pay | Admitting: Family Medicine

## 2024-04-11 DIAGNOSIS — E785 Hyperlipidemia, unspecified: Secondary | ICD-10-CM

## 2024-04-17 ENCOUNTER — Other Ambulatory Visit: Payer: Self-pay | Admitting: Family Medicine

## 2024-04-17 DIAGNOSIS — E785 Hyperlipidemia, unspecified: Secondary | ICD-10-CM

## 2024-05-10 ENCOUNTER — Other Ambulatory Visit: Payer: Self-pay | Admitting: Family Medicine

## 2024-05-10 DIAGNOSIS — I10 Essential (primary) hypertension: Secondary | ICD-10-CM

## 2024-05-12 ENCOUNTER — Ambulatory Visit (INDEPENDENT_AMBULATORY_CARE_PROVIDER_SITE_OTHER): Admitting: *Deleted

## 2024-05-12 VITALS — Ht 68.5 in | Wt 202.0 lb

## 2024-05-12 DIAGNOSIS — Z0000 Encounter for general adult medical examination without abnormal findings: Secondary | ICD-10-CM

## 2024-05-12 NOTE — Patient Instructions (Signed)
 Melissa Miles , Thank you for taking time to come for your Medicare Wellness Visit. I appreciate your ongoing commitment to your health goals. Please review the following plan we discussed and let me know if I can assist you in the future.   Screening recommendations/referrals: Colonoscopy: up to date Mammogram: up to date Bone Density: up to date Recommended yearly ophthalmology/optometry visit for glaucoma screening and checkup Recommended yearly dental visit for hygiene and checkup  Vaccinations: Influenza vaccine: Education provided Pneumococcal vaccine: up to date Tdap vaccine: Education provided        Preventive Care 65 Years and Older, Female Preventive care refers to lifestyle choices and visits with your health care provider that can promote health and wellness. What does preventive care include? A yearly physical exam. This is also called an annual well check. Dental exams once or twice a year. Routine eye exams. Ask your health care provider how often you should have your eyes checked. Personal lifestyle choices, including: Daily care of your teeth and gums. Regular physical activity. Eating a healthy diet. Avoiding tobacco and drug use. Limiting alcohol use. Practicing safe sex. Taking low-dose aspirin every day. Taking vitamin and mineral supplements as recommended by your health care provider. What happens during an annual well check? The services and screenings done by your health care provider during your annual well check will depend on your age, overall health, lifestyle risk factors, and family history of disease. Counseling  Your health care provider may ask you questions about your: Alcohol use. Tobacco use. Drug use. Emotional well-being. Home and relationship well-being. Sexual activity. Eating habits. History of falls. Memory and ability to understand (cognition). Work and work Astronomer. Reproductive health. Screening  You may have the  following tests or measurements: Height, weight, and BMI. Blood pressure. Lipid and cholesterol levels. These may be checked every 5 years, or more frequently if you are over 51 years old. Skin check. Lung cancer screening. You may have this screening every year starting at age 68 if you have a 30-pack-year history of smoking and currently smoke or have quit within the past 15 years. Fecal occult blood test (FOBT) of the stool. You may have this test every year starting at age 66. Flexible sigmoidoscopy or colonoscopy. You may have a sigmoidoscopy every 5 years or a colonoscopy every 10 years starting at age 81. Hepatitis C blood test. Hepatitis B blood test. Sexually transmitted disease (STD) testing. Diabetes screening. This is done by checking your blood sugar (glucose) after you have not eaten for a while (fasting). You may have this done every 1-3 years. Bone density scan. This is done to screen for osteoporosis. You may have this done starting at age 36. Mammogram. This may be done every 1-2 years. Talk to your health care provider about how often you should have regular mammograms. Talk with your health care provider about your test results, treatment options, and if necessary, the need for more tests. Vaccines  Your health care provider may recommend certain vaccines, such as: Influenza vaccine. This is recommended every year. Tetanus, diphtheria, and acellular pertussis (Tdap, Td) vaccine. You may need a Td booster every 10 years. Zoster vaccine. You may need this after age 50. Pneumococcal 13-valent conjugate (PCV13) vaccine. One dose is recommended after age 75. Pneumococcal polysaccharide (PPSV23) vaccine. One dose is recommended after age 61. Talk to your health care provider about which screenings and vaccines you need and how often you need them. This information is not intended to  replace advice given to you by your health care provider. Make sure you discuss any questions you  have with your health care provider. Document Released: 10/07/2015 Document Revised: 05/30/2016 Document Reviewed: 07/12/2015 Elsevier Interactive Patient Education  2017 [employer].  Fall Prevention in the Home Falls can cause injuries. They can happen to people of all ages. There are many things you can do to make your home safe and to help prevent falls. What can I do on the outside of my home? Regularly fix the edges of walkways and driveways and fix any cracks. Remove anything that might make you trip as you walk through a door, such as a raised step or threshold. Trim any bushes or trees on the path to your home. Use bright outdoor lighting. Clear any walking paths of anything that might make someone trip, such as rocks or tools. Regularly check to see if handrails are loose or broken. Make sure that both sides of any steps have handrails. Any raised decks and porches should have guardrails on the edges. Have any leaves, snow, or ice cleared regularly. Use sand or salt on walking paths during winter. Clean up any spills in your garage right away. This includes oil or grease spills. What can I do in the bathroom? Use night lights. Install grab bars by the toilet and in the tub and shower. Do not use towel bars as grab bars. Use non-skid mats or decals in the tub or shower. If you need to sit down in the shower, use a plastic, non-slip stool. Keep the floor dry. Clean up any water that spills on the floor as soon as it happens. Remove soap buildup in the tub or shower regularly. Attach bath mats securely with double-sided non-slip rug tape. Do not have throw rugs and other things on the floor that can make you trip. What can I do in the bedroom? Use night lights. Make sure that you have a light by your bed that is easy to reach. Do not use any sheets or blankets that are too big for your bed. They should not hang down onto the floor. Have a firm chair that has side arms. You can  use this for support while you get dressed. Do not have throw rugs and other things on the floor that can make you trip. What can I do in the kitchen? Clean up any spills right away. Avoid walking on wet floors. Keep items that you use a lot in easy-to-reach places. If you need to reach something above you, use a strong step stool that has a grab bar. Keep electrical cords out of the way. Do not use floor polish or wax that makes floors slippery. If you must use wax, use non-skid floor wax. Do not have throw rugs and other things on the floor that can make you trip. What can I do with my stairs? Do not leave any items on the stairs. Make sure that there are handrails on both sides of the stairs and use them. Fix handrails that are broken or loose. Make sure that handrails are as long as the stairways. Check any carpeting to make sure that it is firmly attached to the stairs. Fix any carpet that is loose or worn. Avoid having throw rugs at the top or bottom of the stairs. If you do have throw rugs, attach them to the floor with carpet tape. Make sure that you have a light switch at the top of the stairs and the  bottom of the stairs. If you do not have them, ask someone to add them for you. What else can I do to help prevent falls? Wear shoes that: Do not have high heels. Have rubber bottoms. Are comfortable and fit you well. Are closed at the toe. Do not wear sandals. If you use a stepladder: Make sure that it is fully opened. Do not climb a closed stepladder. Make sure that both sides of the stepladder are locked into place. Ask someone to hold it for you, if possible. Clearly mark and make sure that you can see: Any grab bars or handrails. First and last steps. Where the edge of each step is. Use tools that help you move around (mobility aids) if they are needed. These include: Canes. Walkers. Scooters. Crutches. Turn on the lights when you go into a dark area. Replace any light  bulbs as soon as they burn out. Set up your furniture so you have a clear path. Avoid moving your furniture around. If any of your floors are uneven, fix them. If there are any pets around you, be aware of where they are. Review your medicines with your doctor. Some medicines can make you feel dizzy. This can increase your chance of falling. Ask your doctor what other things that you can do to help prevent falls. This information is not intended to replace advice given to you by your health care provider. Make sure you discuss any questions you have with your health care provider. Document Released: 07/07/2009 Document Revised: 02/16/2016 Document Reviewed: 10/15/2014 Elsevier Interactive Patient Education  2017 [employer].

## 2024-05-12 NOTE — Progress Notes (Signed)
 Subjective:   Melissa Miles is a 68 y.o. female who presents for Medicare Annual (Subsequent) preventive examination.  Visit Complete: Virtual I connected with  Melissa Miles on 05/12/24 by a audio enabled telemedicine application and verified that I am speaking with the correct person using two identifiers.  Patient Location: Home  Provider Location: Home Office  I discussed the limitations of evaluation and management by telemedicine. The patient expressed understanding and agreed to proceed.  Vital Signs: Because this visit was a virtual/telehealth visit, some criteria may be missing or patient reported. Any vitals not documented were not able to be obtained and vitals that have been documented are patient reported.    Cardiac Risk Factors include: advanced age (>15men, >55 women);obesity (BMI >30kg/m2);hypertension     Objective:    Today's Vitals   05/12/24 1542  Weight: 202 lb (91.6 kg)  Height: 5' 8.5 (1.74 m)   Body mass index is 30.27 kg/m.     05/12/2024    3:42 PM 07/31/2022    1:22 PM 07/31/2022    6:53 AM 05/14/2022    7:47 AM 05/09/2022    9:34 AM 11/13/2021    7:20 AM  Advanced Directives  Does Patient Have a Medical Advance Directive? No No No  No No  Would patient like information on creating a medical advance directive? No - Patient declined  No - Patient declined No - Patient declined Yes (MAU/Ambulatory/Procedural Areas - Information given) No - Patient declined    Current Medications (verified) Outpatient Encounter Medications as of 05/12/2024  Medication Sig   aspirin 81 MG tablet Take 81 mg by mouth daily.   atorvastatin  (LIPITOR) 40 MG tablet Take 1 tablet by mouth once daily   DULoxetine  (CYMBALTA ) 60 MG capsule Take 1 capsule by mouth once daily   famotidine  (PEPCID ) 20 MG tablet Take 1 tablet (20 mg total) by mouth 2 (two) times daily.   gemfibrozil  (LOPID ) 600 MG tablet TAKE 1 TABLET BY MOUTH TWICE DAILY BEFORE MEAL(S)    losartan -hydrochlorothiazide (HYZAAR) 50-12.5 MG tablet Take 1 tablet by mouth once daily   No facility-administered encounter medications on file as of 05/12/2024.    Allergies (verified) Patient has no known allergies.   History: Past Medical History:  Diagnosis Date   Anxiety    GERD (gastroesophageal reflux disease)    History of kidney stones    Hyperlipidemia    Hyperparathyroidism (HCC)    IBS (irritable bowel syndrome)    Dr Aneita   Tubular adenoma of colon 03/2020   Past Surgical History:  Procedure Laterality Date   CERVICAL BIOPSY     X2 ; PMH abnormal PAP   CHOLECYSTECTOMY     CHOLECYSTECTOMY, LAPAROSCOPIC  2002   COLONOSCOPY      negative X 3   EXTRACORPOREAL SHOCK WAVE LITHOTRIPSY Left 11/13/2021   Procedure: EXTRACORPOREAL SHOCK WAVE LITHOTRIPSY (ESWL);  Surgeon: Alvaro Hummer, MD;  Location: [facility];  Service: Urology;  Laterality: Left;  75 MINS   PARATHYROIDECTOMY N/A 05/14/2022   Procedure: NECK EXPLORATION WITH PARATHYROIDECTOMY;  Surgeon: Eletha Boas, MD;  Location: [facility];  Service: General;  Laterality: N/A;   TUMOR REMOVAL     on buttocks   Family History  Problem Relation Age of Onset   Mental illness Other        half sister had  ? bipolar   Heart attack Other 54       half bro    Cancer Father  NHL   Osteoporosis Mother    Thyroid  disease Other        half bro , hypothyroidism   Hypercalcemia Neg Hx    Colon cancer Neg Hx    Esophageal cancer Neg Hx    Rectal cancer Neg Hx    Stomach cancer Neg Hx    Social History   Socioeconomic History   Marital status: Married    Spouse name: Not on file   Number of children: Not on file   Years of education: Not on file   Highest education level: Not on file  Occupational History   Not on file  Tobacco Use   Smoking status: Never   Smokeless tobacco: Never  Vaping Use   Vaping status: Never Used  Substance and Sexual Activity   Alcohol use: Yes    Comment:  rarely- on beach   Drug use: No   Sexual activity: Not on file  Other Topics Concern   Not on file  Social History Narrative   Not on file   Social Drivers of Health   Financial Resource Strain: Low Risk  (05/12/2024)   Overall Financial Resource Strain (CARDIA)    Difficulty of Paying Living Expenses: Not hard at all  Food Insecurity: No Food Insecurity (05/12/2024)   Hunger Vital Sign    Worried About Running Out of Food in the Last Year: Never true    Ran Out of Food in the Last Year: Never true  Transportation Needs: No Transportation Needs (05/12/2024)   PRAPARE - Administrator, Civil Service (Medical): No    Lack of Transportation (Non-Medical): No  Physical Activity: Inactive (05/12/2024)   Exercise Vital Sign    Days of Exercise per Week: 0 days    Minutes of Exercise per Session: 0 min  Stress: No Stress Concern Present (05/12/2024)   [employer] of Occupational Health - Occupational Stress Questionnaire    Feeling of Stress: Not at all  Social Connections: Moderately Integrated (05/12/2024)   Social Connection and Isolation Panel    Frequency of Communication with Friends and Family: More than three times a week    Frequency of Social Gatherings with Friends and Family: More than three times a week    Attends Religious Services: Never    Database administrator or Organizations: No    Attends Engineer, structural: 1 to 4 times per year    Marital Status: Married    Tobacco Counseling Counseling given: Not Answered   Clinical Intake:  Pre-visit preparation completed: Yes  Pain : No/denies pain     Diabetes: No  How often do you need to have someone help you when you read instructions, pamphlets, or other written materials from your doctor or pharmacy?: 1 - Never  Interpreter Needed?: No  Information entered by :: Mliss Graff LPN   Activities of Daily Living    05/12/2024    3:43 PM  In your present state of health, do you have  any difficulty performing the following activities:  Hearing? 0  Vision? 0  Difficulty concentrating or making decisions? 0  Walking or climbing stairs? 0  Dressing or bathing? 0  Doing errands, shopping? 0  Preparing Food and eating ? N  Using the Toilet? N  In the past six months, have you accidently leaked urine? N  Do you have problems with loss of bowel control? N  Managing your Medications? N  Managing your Finances? N  Housekeeping or managing  your Housekeeping? N    Patient Care Team: Mahlon Comer BRAVO, MD as PCP - General (Family Medicine) Darina Barrio, MD as Consulting Physician (Gynecology) Aneita Gwendlyn DASEN, MD (Inactive) as Consulting Physician (Gastroenterology) [facility], [facility]  Indicate any recent Medical Services you may have received from other than [facility] providers in the past year (date may be approximate).     Assessment:   This is a routine wellness examination for Ifeoluwa.  Hearing/Vision screen Hearing Screening - Comments:: No trouble hearing Vision Screening - Comments:: Unsure of name   Goals Addressed             This Visit's Progress    Weight (lb) < 200 lb (90.7 kg)   202 lb (91.6 kg)      Depression Screen    05/12/2024    3:41 PM 02/24/2024    2:03 PM 08/16/2023    2:42 PM 08/16/2023    2:35 PM 08/16/2023    2:22 PM 02/13/2023    2:07 PM 09/25/2022   11:16 AM  PHQ 2/9 Scores  PHQ - 2 Score 0 0 0 2 0 0 0  PHQ- 9 Score 1 0 0 16 0 1 0    Fall Risk    05/12/2024    3:36 PM 02/24/2024    2:03 PM 08/16/2023    2:22 PM 02/13/2023    2:07 PM 09/25/2022   11:16 AM  Fall Risk   Falls in the past year? 0 0 0 0 0  Number falls in past yr: 0 0 0 0 0  Injury with Fall? 0 0 0 0 0  Risk for fall due to :   No Fall Risks No Fall Risks No Fall Risks  Follow up Falls evaluation completed;Falls prevention discussed;Education provided  Falls evaluation completed Falls evaluation completed Falls evaluation completed      Data  saved with a previous flowsheet row definition    MEDICARE RISK AT HOME: Medicare Risk at Home Any stairs in or around the home?: No If so, are there any without handrails?: No Home free of loose throw rugs in walkways, pet beds, electrical cords, etc?: Yes Adequate lighting in your home to reduce risk of falls?: Yes Life alert?: No Use of a cane, walker or w/c?: No Grab bars in the bathroom?: No Shower chair or bench in shower?: No Elevated toilet seat or a handicapped toilet?: Yes  TIMED UP AND GO:  Was the test performed?  No    Cognitive Function:        05/12/2024    3:38 PM  6CIT Screen  What Year? 0 points  What month? 0 points  What time? 0 points  Count back from 20 0 points  Months in reverse 0 points  Repeat phrase 0 points  Total Score 0 points    Immunizations Immunization History  Administered Date(s) Administered   Fluad Trivalent(High Dose 65+) 08/16/2023   Influenza Whole 07/01/2008   Influenza, High Dose Seasonal PF 09/03/2022   Influenza,inj,Quad PF,6+ Mos 07/09/2014, 07/20/2015, 07/18/2016, 07/19/2017, 07/29/2019, 08/03/2020, 08/09/2021   Influenza-Unspecified 06/24/2018, 07/25/2022, 08/25/2023   PFIZER(Purple Top)SARS-COV-2 Vaccination 11/21/2019, 12/12/2019, 10/07/2020   PNEUMOCOCCAL CONJUGATE-20 09/03/2022   Pneumococcal Conjugate,unspecified 07/25/2022   Td 01/24/2010   Tdap 08/09/2021    TDAP status: Up to date  Flu Vaccine status: Due, Education has been provided regarding the importance of this vaccine. Advised may receive this vaccine at local pharmacy or Health Dept. Aware to provide a copy of  the vaccination record if obtained from local pharmacy or Health Dept. Verbalized acceptance and understanding.  Pneumococcal vaccine status: Up to date  Covid-19 vaccine status: Declined, Education has been provided regarding the importance of this vaccine but patient still declined. Advised may receive this vaccine at local pharmacy or Health  Dept.or vaccine clinic. Aware to provide a copy of the vaccination record if obtained from local pharmacy or Health Dept. Verbalized acceptance and understanding.  Qualifies for Shingles Vaccine? Yes   Zostavax completed No   Shingrix Completed?: No.    Education has been provided regarding the importance of this vaccine. Patient has been advised to call insurance company to determine out of pocket expense if they have not yet received this vaccine. Advised may also receive vaccine at local pharmacy or Health Dept. Verbalized acceptance and understanding.  Screening Tests Health Maintenance  Topic Date Due   Zoster Vaccines- Shingrix (1 of 2) Never done   INFLUENZA VACCINE  04/24/2024   MAMMOGRAM  09/29/2024   DEXA SCAN  03/26/2025   Medicare Annual Wellness (AWV)  05/12/2025   Colonoscopy  03/26/2027   DTaP/Tdap/Td (3 - Td or Tdap) 08/10/2031   Pneumococcal Vaccine: 50+ Years  Completed   Hepatitis C Screening  Completed   HPV VACCINES  Aged Out   Meningococcal B Vaccine  Aged Out   Pneumococcal Vaccine  Discontinued   COVID-19 Vaccine  Discontinued    Health Maintenance  Health Maintenance Due  Topic Date Due   Zoster Vaccines- Shingrix (1 of 2) Never done   INFLUENZA VACCINE  04/24/2024    Colorectal cancer screening: Type of screening: Colonoscopy. Completed 2021. Repeat every 7 years  Mammogram status: Completed  . Repeat every year  Bone Density status: Completed 2022. Results reflect: Bone density results: OSTEOPENIA. Repeat every 2 years.  Lung Cancer Screening: (Low Dose CT Chest recommended if Age 45-80 years, 20 pack-year currently smoking OR have quit w/in 15years.) does not qualify.   Lung Cancer Screening Referral:   Additional Screening:  Hepatitis C Screening: does not qualify; Completed 2021  Vision Screening: Recommended annual ophthalmology exams for early detection of glaucoma and other disorders of the eye. Is the patient up to date with their  annual eye exam?  No  Who is the provider or what is the name of the office in which the patient attends annual eye exams? Education provided If pt is not established with a provider, would they like to be referred to a provider to establish care? No .   Dental Screening: Recommended annual dental exams for proper oral hygiene    Community Resource Referral / Chronic Care Management: CRR required this visit?  No   CCM required this visit?  No     Plan:     I have personally reviewed and noted the following in the patient's chart:   Medical and social history Use of alcohol, tobacco or illicit drugs  Current medications and supplements including opioid prescriptions. Patient is not currently taking opioid prescriptions. Functional ability and status Nutritional status Physical activity Advanced directives List of other physicians Hospitalizations, surgeries, and ER visits in previous 12 months Vitals Screenings to include cognitive, depression, and falls Referrals and appointments  In addition, I have reviewed and discussed with patient certain preventive protocols, quality metrics, and best practice recommendations. A written personalized care plan for preventive services as well as general preventive health recommendations were provided to patient.     Mliss Graff, LPN   [id number]  After Visit Summary: (MyChart) Due to this being a telephonic visit, the after visit summary with patients personalized plan was offered to patient via MyChart   Nurse Notes:

## 2024-05-15 ENCOUNTER — Other Ambulatory Visit: Payer: Self-pay | Admitting: Family Medicine

## 2024-07-10 ENCOUNTER — Other Ambulatory Visit: Payer: Self-pay | Admitting: Family Medicine

## 2024-07-10 DIAGNOSIS — E785 Hyperlipidemia, unspecified: Secondary | ICD-10-CM

## 2024-07-16 ENCOUNTER — Other Ambulatory Visit: Payer: Self-pay | Admitting: Family Medicine

## 2024-07-19 ENCOUNTER — Other Ambulatory Visit: Payer: Self-pay | Admitting: Family Medicine

## 2024-08-13 ENCOUNTER — Other Ambulatory Visit: Payer: Self-pay | Admitting: Family Medicine

## 2024-08-13 DIAGNOSIS — I10 Essential (primary) hypertension: Secondary | ICD-10-CM

## 2024-08-18 ENCOUNTER — Other Ambulatory Visit: Payer: Self-pay | Admitting: Family Medicine

## 2024-08-26 ENCOUNTER — Ambulatory Visit: Admitting: Family Medicine

## 2024-08-26 ENCOUNTER — Encounter: Payer: Self-pay | Admitting: Family Medicine

## 2024-08-26 VITALS — BP 120/80 | HR 101 | Temp 98.3°F | Ht 68.5 in | Wt 202.6 lb

## 2024-08-26 DIAGNOSIS — E785 Hyperlipidemia, unspecified: Secondary | ICD-10-CM | POA: Diagnosis not present

## 2024-08-26 DIAGNOSIS — Z23 Encounter for immunization: Secondary | ICD-10-CM

## 2024-08-26 DIAGNOSIS — Z0000 Encounter for general adult medical examination without abnormal findings: Secondary | ICD-10-CM | POA: Diagnosis not present

## 2024-08-26 NOTE — Patient Instructions (Signed)
 Follow up in 6 months to recheck cholesterol We'll notify you of your lab results and make any changes if needed Keep up the good work on healthy diet and regular exercise- you look great!!! Call with any questions or concerns Stay Safe!  Stay Healthy! Merry Christmas!!

## 2024-08-26 NOTE — Progress Notes (Unsigned)
   Subjective:    Patient ID: Melissa Miles, female    DOB: 13-Aug-1956, 68 y.o.   MRN: [id number]  HPI CPE- UTD on mammo, DEXA, colonoscopy, Tdap, PNA  Patient Care Team    Relationship Specialty Notifications Start End  Mahlon Comer BRAVO, MD PCP - General Family Medicine  09/25/22   Darina Barrio, MD Consulting Physician Gynecology  07/20/15   Aneita Gwendlyn DASEN, MD (Inactive) Consulting Physician Gastroenterology  07/29/19   [place], [facility]    02/24/24     Health Maintenance  Topic Date Due   Zoster Vaccines- Shingrix (1 of 2) Never done   Influenza Vaccine  04/24/2024   COVID-19 Vaccine (4 - 2025-26 season) 05/25/2024   Mammogram  09/29/2024   Bone Density Scan  03/26/2025   Medicare Annual Wellness (AWV)  05/12/2025   Colonoscopy  03/26/2027   DTaP/Tdap/Td (3 - Td or Tdap) 08/10/2031   Pneumococcal Vaccine: 50+ Years  Completed   Hepatitis C Screening  Completed   Meningococcal B Vaccine  Aged Out      Review of Systems Patient reports no vision/ hearing changes, adenopathy,fever, weight change,  persistant/recurrent hoarseness , swallowing issues, chest pain, palpitations, edema, persistant/recurrent cough, hemoptysis, dyspnea (rest/exertional/paroxysmal nocturnal), gastrointestinal bleeding (melena, rectal bleeding), abdominal pain, significant heartburn, bowel changes, GU symptoms (dysuria, hematuria, incontinence), Gyn symptoms (abnormal  bleeding, pain),  syncope, focal weakness, memory loss, numbness & tingling, skin/hair/nail changes, abnormal bruising or bleeding, anxiety, or depression.     Objective:   Physical Exam General Appearance:    Alert, cooperative, no distress, appears stated age  Head:    Normocephalic, without obvious abnormality, atraumatic  Eyes:    PERRL, conjunctiva/corneas clear, EOM's intact both eyes  Ears:    Normal TM's and external ear canals, both ears  Nose:   Nares normal, septum midline, mucosa normal, no drainage    or  sinus tenderness  Throat:   Lips, mucosa, and tongue normal; teeth and gums normal  Neck:   Supple, symmetrical, trachea midline, no adenopathy;    Thyroid : no enlargement/tenderness/nodules  Back:     Symmetric, no curvature, ROM normal, no CVA tenderness  Lungs:     Clear to auscultation bilaterally, respirations unlabored  Chest Wall:    No tenderness or deformity   Heart:    Regular rate and rhythm, S1 and S2 normal, no murmur, rub   or gallop  Breast Exam:    Deferred to GYN  Abdomen:     Soft, non-tender, bowel sounds active all four quadrants,    no masses, no organomegaly  Genitalia:    Deferred to GYN  Rectal:    Extremities:   Extremities normal, atraumatic, no cyanosis or edema  Pulses:   2+ and symmetric all extremities  Skin:   Skin color, texture, turgor normal, no rashes or lesions  Lymph nodes:   Cervical, supraclavicular, and axillary nodes normal  Neurologic:   CNII-XII intact, normal strength, sensation and reflexes    throughout          Assessment & Plan:

## 2024-08-27 LAB — BASIC METABOLIC PANEL WITH GFR
BUN: 21 mg/dL (ref 6–23)
CO2: 27 meq/L (ref 19–32)
Calcium: 9.8 mg/dL (ref 8.4–10.5)
Chloride: 99 meq/L (ref 96–112)
Creatinine, Ser: 0.71 mg/dL (ref 0.40–1.20)
GFR: 88 mL/min (ref >60.00)
Glucose, Bld: 99 mg/dL (ref 70–99)
Potassium: 3.7 meq/L (ref 3.5–5.1)
Sodium: 137 meq/L (ref 135–145)

## 2024-08-27 LAB — CBC WITH DIFFERENTIAL/PLATELET
Basophils Absolute: 0 K/uL (ref 0.0–0.1)
Basophils Relative: 0.2 % (ref 0.0–3.0)
Eosinophils Absolute: 0.2 K/uL (ref 0.0–0.7)
Eosinophils Relative: 2.5 % (ref 0.0–5.0)
HCT: 40.4 % (ref 36.0–46.0)
Hemoglobin: 13.5 g/dL (ref 12.0–15.0)
Lymphocytes Relative: 36.1 % (ref 12.0–46.0)
Lymphs Abs: 2.4 K/uL (ref 0.7–4.0)
MCHC: 33.4 g/dL (ref 30.0–36.0)
MCV: 90.8 fl (ref 78.0–100.0)
Monocytes Absolute: 0.4 K/uL (ref 0.1–1.0)
Monocytes Relative: 6.6 % (ref 3.0–12.0)
Neutro Abs: 3.6 K/uL (ref 1.4–7.7)
Neutrophils Relative %: 54.6 % (ref 43.0–77.0)
Platelets: 255 K/uL (ref 150.0–400.0)
RBC: 4.45 Mil/uL (ref 3.87–5.11)
RDW: 13.2 % (ref 11.5–15.5)
WBC: 6.6 K/uL (ref 4.0–10.5)

## 2024-08-27 LAB — LIPID PANEL
Cholesterol: 140 mg/dL (ref 0–200)
HDL: 32.7 mg/dL — ABNORMAL LOW (ref >39.00)
LDL Cholesterol: 81 mg/dL (ref 0–99)
NonHDL: 107.68
Total CHOL/HDL Ratio: 4
Triglycerides: 133 mg/dL (ref 0.0–149.0)
VLDL: 26.6 mg/dL (ref 0.0–40.0)

## 2024-08-27 LAB — HEPATIC FUNCTION PANEL
ALT: 66 U/L — ABNORMAL HIGH (ref 0–35)
AST: 72 U/L — ABNORMAL HIGH (ref 0–37)
Albumin: 4.6 g/dL (ref 3.5–5.2)
Alkaline Phosphatase: 46 U/L (ref 39–117)
Bilirubin, Direct: 0.1 mg/dL (ref 0.0–0.3)
Total Bilirubin: 0.6 mg/dL (ref 0.2–1.2)
Total Protein: 7.7 g/dL (ref 6.0–8.3)

## 2024-08-27 LAB — TSH: TSH: 1.63 u[IU]/mL (ref 0.35–5.50)

## 2024-08-27 NOTE — Assessment & Plan Note (Signed)
 Pt's PE WNL w/ exception of BMI.  UTD on mammo, DEXA, colonoscopy, Tdap, PNA.  Flu given.  Check labs.  Anticipatory guidance provided.

## 2024-08-30 ENCOUNTER — Ambulatory Visit: Payer: Self-pay | Admitting: Family Medicine

## 2024-09-07 ENCOUNTER — Other Ambulatory Visit: Payer: Self-pay | Admitting: Family Medicine

## 2024-10-06 ENCOUNTER — Other Ambulatory Visit: Payer: Self-pay | Admitting: Family Medicine

## 2024-10-06 DIAGNOSIS — E785 Hyperlipidemia, unspecified: Secondary | ICD-10-CM

## 2025-02-24 ENCOUNTER — Ambulatory Visit: Admitting: Family Medicine

## 2025-05-25 ENCOUNTER — Encounter
